# Patient Record
Sex: Male | Born: 1955 | Race: White | Hispanic: No | Marital: Single | Smoking: Former smoker
Health system: Southern US, Community
[De-identification: ages and names within clinical notes are randomized; demographics above are authoritative.]

## PROBLEM LIST (undated history)

## (undated) ENCOUNTER — Ambulatory Visit: Payer: Medicare HMO | Source: Home / Self Care

## (undated) DIAGNOSIS — I10 Essential (primary) hypertension: Secondary | ICD-10-CM

## (undated) DIAGNOSIS — K219 Gastro-esophageal reflux disease without esophagitis: Secondary | ICD-10-CM

## (undated) DIAGNOSIS — E785 Hyperlipidemia, unspecified: Secondary | ICD-10-CM

## (undated) DIAGNOSIS — Z860101 Personal history of adenomatous and serrated colon polyps: Secondary | ICD-10-CM

## (undated) DIAGNOSIS — M1990 Unspecified osteoarthritis, unspecified site: Secondary | ICD-10-CM

## (undated) HISTORY — PX: CHOLECYSTECTOMY: SHX55

## (undated) HISTORY — PX: VASECTOMY: SHX75

## (undated) HISTORY — DX: Personal history of adenomatous and serrated colon polyps: Z86.0101

## (undated) HISTORY — DX: Hyperlipidemia, unspecified: E78.5

---

## 2016-10-28 ENCOUNTER — Encounter: Payer: Self-pay | Admitting: *Deleted

## 2016-10-28 ENCOUNTER — Ambulatory Visit
Admission: EM | Admit: 2016-10-28 | Discharge: 2016-10-28 | Disposition: A | Discharge status: Home / Self Care | Payer: Self-pay | Attending: Family Medicine | Admitting: Family Medicine

## 2016-10-28 DIAGNOSIS — I10 Essential (primary) hypertension: Secondary | ICD-10-CM

## 2016-10-28 DIAGNOSIS — G5603 Carpal tunnel syndrome, bilateral upper limbs: Secondary | ICD-10-CM

## 2016-10-28 HISTORY — DX: Essential (primary) hypertension: I10

## 2016-10-28 HISTORY — DX: Gastro-esophageal reflux disease without esophagitis: K21.9

## 2016-10-28 HISTORY — DX: Unspecified osteoarthritis, unspecified site: M19.90

## 2016-10-28 LAB — BASIC METABOLIC PANEL
Anion gap: 8 (ref 5–15)
BUN: 16 mg/dL (ref 6–20)
CALCIUM: 9.7 mg/dL (ref 8.9–10.3)
CO2: 24 mmol/L (ref 22–32)
CREATININE: 0.76 mg/dL (ref 0.61–1.24)
Chloride: 103 mmol/L (ref 101–111)
GFR calc non Af Amer: >60 mL/min (ref >60)
Glucose, Bld: 103 mg/dL — ABNORMAL HIGH (ref 65–99)
Potassium: 3.6 mmol/L (ref 3.5–5.1)
SODIUM: 135 mmol/L (ref 135–145)

## 2016-10-28 MED ORDER — LISINOPRIL-HYDROCHLOROTHIAZIDE 20-12.5 MG PO TABS: 1.0000 | ORAL_TABLET | Freq: Every day | ORAL | 0 refills | Status: AC

## 2016-10-28 NOTE — ED Triage Notes (Signed)
Patient has been experiencing numbness and tingling in arms and had bilateral for the last 4 months. 1 week ago the tingling became worse.

## 2016-10-28 NOTE — ED Provider Notes (Signed)
[facility]    CSN: [id number] Arrival date & time: 10/28/16  1506     History   Chief Complaint Chief Complaint  Patient presents with  . Tingling    HPI Lucas Gerst. is a 61 y.o. male.   60 yo male with a c/o bilateral hand/finger numbness/tingling for 4 months but progressively worsening. Denies any injuries. Also requesting refill on blood pressure medication while he finds a new pcp as he moved here recently from [place].    The history is provided by the patient.    Past Medical History:  Diagnosis Date  . Arthritis   . GERD (gastroesophageal reflux disease)   . Hypertension     There are no active problems to display for this patient.   Past Surgical History:  Procedure Laterality Date  . CHOLECYSTECTOMY         Home Medications    Prior to Admission medications   Medication Sig Start Date End Date Taking? Authorizing Provider  atenolol (TENORMIN) 100 MG tablet Take 100 mg by mouth daily.   Yes Historical Provider, MD  esomeprazole (NEXIUM) 40 MG capsule Take 40 mg by mouth daily at 12 noon.   Yes Historical Provider, MD  lisinopril-hydrochlorothiazide (PRINZIDE,ZESTORETIC) 20-12.5 MG tablet Take 1 tablet by mouth daily. 10/28/16   Payton Mccallum, MD    Family History Family History  Problem Relation Age of Onset  . Dementia Mother     Social History Social History  Substance Use Topics  . Smoking status: Current Every Day Smoker    Packs/day: 1.00    Types: Cigarettes  . Smokeless tobacco: Former Neurosurgeon  . Alcohol use Yes     Allergies   Patient has no known allergies.   Review of Systems Review of Systems   Physical Exam Triage Vital Signs ED Triage Vitals  Enc Vitals Group     BP 10/28/16 1536 133/83     Pulse Rate 10/28/16 1536 (!) 117     Resp 10/28/16 1536 16     Temp 10/28/16 1536 98.2 F (36.8 C)     Temp Source 10/28/16 1536 Oral     SpO2 10/28/16 1536 99 %     Weight 10/28/16 1537 197 lb (89.4  kg)     Height 10/28/16 1537 6' (1.829 m)     Head Circumference --      Peak Flow --      Pain Score 10/28/16 1538 4     Pain Loc --      Pain Edu? --      Excl. in GC? --    No data found.   Updated Vital Signs BP 133/83 (BP Location: Left Arm)   Pulse (!) 117   Temp 98.2 F (36.8 C) (Oral)   Resp 16   Ht 6' (1.829 m)   Wt 197 lb (89.4 kg)   SpO2 99%   BMI 26.72 kg/m   Visual Acuity Right Eye Distance:   Left Eye Distance:   Bilateral Distance:    Right Eye Near:   Left Eye Near:    Bilateral Near:     Physical Exam  Constitutional: He appears well-developed and well-nourished. No distress.  Cardiovascular: Normal rate, regular rhythm, normal heart sounds and intact distal pulses.   Pulmonary/Chest: Effort normal and breath sounds normal. No respiratory distress.  Musculoskeletal: He exhibits no edema, tenderness or deformity.  Neurological: He is alert.  Positive Phalen's test  Skin: He is not diaphoretic.  Vitals reviewed.    UC Treatments / Results  Labs (all labs ordered are listed, but only abnormal results are displayed) Labs Reviewed  BASIC METABOLIC PANEL - Abnormal; Notable for the following:       Result Value   Glucose, Bld 103 (*)    All other components within normal limits    EKG  EKG Interpretation None       Radiology No results found.  Procedures Procedures (including critical care time)  Medications Ordered in UC Medications - No data to display   Initial Impression / Assessment and Plan / UC Course  I have reviewed the triage vital signs and the nursing notes.  Pertinent labs & imaging results that were available during my care of the patient were reviewed by me and considered in my medical decision making (see chart for details).       Final Clinical Impressions(s) / UC Diagnoses   Final diagnoses:  Bilateral carpal tunnel syndrome  Hypertension, unspecified type    New Prescriptions Discharge Medication  List as of 10/28/2016  4:33 PM     1. Lab results and diagnosis reviewed with patient 2. rx as per orders above; reviewed possible side effects, interactions, risks and benefits; rx sent for lisinopril/HCTZ as per orders   3. Recommend supportive treatment with wrist splints, otc analgesics prn 4. Follow-up prn if symptoms worsen or don't improve   Payton Mccallum, MD 10/28/16 [phone]

## 2016-12-25 ENCOUNTER — Ambulatory Visit
Admission: EM | Admit: 2016-12-25 | Discharge: 2016-12-25 | Disposition: A | Discharge status: Home / Self Care | Payer: BLUE CROSS/BLUE SHIELD | Attending: Family Medicine | Admitting: Family Medicine

## 2016-12-25 DIAGNOSIS — R109 Unspecified abdominal pain: Secondary | ICD-10-CM

## 2016-12-25 DIAGNOSIS — R197 Diarrhea, unspecified: Secondary | ICD-10-CM

## 2016-12-25 MED ORDER — HYOSCYAMINE SULFATE SL 0.125 MG SL SUBL: SUBLINGUAL_TABLET | SUBLINGUAL | 0 refills | Status: DC

## 2016-12-25 MED ORDER — ONDANSETRON HCL 4 MG PO TABS: 4.0000 mg | ORAL_TABLET | Freq: Three times a day (TID) | ORAL | 0 refills | Status: DC | PRN

## 2016-12-25 NOTE — ED Provider Notes (Signed)
CSN: [id number]     Arrival date & time 12/25/16  1100 History   First MD Initiated Contact with Patient 12/25/16 1136     Chief Complaint  Patient presents with  . Abdominal Pain    lower  . Nausea  . Diarrhea   (Consider location/radiation/quality/duration/timing/severity/associated sxs/prior Treatment) 61 year old male presents with intermittent lower abdominal cramping/pain and diarrhea for the past 2 days. He vomited once 2 days ago but otherwise has been able to keep down fluids. Still has some nausea. Denies any fever, URI symptoms, dysuria or blood in his stool. Concerned that many family members and friends have had cancer or other serious chronic illnesses and worried about current etiology. He also knows that a co-worker has had GI issue this week as well. Has not eaten any usual food. No travel. Last colonoscopy about 5 years ago and normal. Has history of HTN, GERD, Hiatal hernia and Tremors and takes medication for management.    The history is provided by the patient.    Past Medical History:  Diagnosis Date  . Arthritis   . GERD (gastroesophageal reflux disease)   . Hypertension    Past Surgical History:  Procedure Laterality Date  . CHOLECYSTECTOMY    . VASECTOMY     Family History  Problem Relation Age of Onset  . Dementia Mother    Social History  Substance Use Topics  . Smoking status: Current Every Day Smoker    Packs/day: 1.00    Types: Cigarettes  . Smokeless tobacco: Current User  . Alcohol use Yes     Comment: occ    Review of Systems  Constitutional: Positive for appetite change. Negative for activity change, chills, fatigue and fever.  HENT: Negative for congestion and sore throat.   Eyes: Negative for pain, discharge and itching.  Respiratory: Negative for cough, chest tightness, shortness of breath and wheezing.   Cardiovascular: Negative for chest pain.  Gastrointestinal: Positive for abdominal pain (cramping), diarrhea and nausea.  Negative for blood in stool and vomiting.  Genitourinary: Negative for decreased urine volume, difficulty urinating, dysuria, flank pain and hematuria.  Musculoskeletal: Negative for back pain and neck pain.  Skin: Negative for rash and wound.  Neurological: Positive for tremors. Negative for dizziness, seizures, syncope, weakness, light-headedness and headaches.  Hematological: Negative for adenopathy.    Allergies  Patient has no known allergies.  Home Medications   Prior to Admission medications   Medication Sig Start Date End Date Taking? Authorizing Provider  atenolol (TENORMIN) 100 MG tablet Take 100 mg by mouth daily.   Yes [provider]  esomeprazole (NEXIUM) 40 MG capsule Take 40 mg by mouth daily at 12 noon.   Yes [provider]  lisinopril-hydrochlorothiazide (PRINZIDE,ZESTORETIC) 20-12.5 MG tablet Take 1 tablet by mouth daily. 10/28/16  Yes Payton Mccallumonty, [place], MD  Hyoscyamine Sulfate SL (LEVSIN/SL) 0.125 MG SUBL Take 1 to 2 tablets every 6 hours as needed for abdominal pain/cramping 12/25/16   Sudie GrumblingAmyot, Ann Berry, NP  ondansetron (ZOFRAN) 4 MG tablet Take 1 tablet (4 mg total) by mouth every 8 (eight) hours as needed for nausea or vomiting. 12/25/16   Amyot, Ali LoweAnn Berry, NP   Meds Ordered and Administered this Visit  Medications - No data to display  BP 118/75 (BP Location: Left Arm)   Pulse (!) 46   Temp 98.5 F (36.9 C) (Oral)   Resp 16   Ht 6' (1.829 m)   Wt 197 lb (89.4 kg)   SpO2  99%   BMI 26.72 kg/m  No data found.   Physical Exam  Constitutional: He is oriented to person, place, and time. He appears well-developed and well-nourished. No distress.  HENT:  Head: Normocephalic and atraumatic.  Right Ear: Hearing, tympanic membrane, external ear and ear canal normal.  Left Ear: Hearing, tympanic membrane, external ear and ear canal normal.  Nose: Nose normal.  Mouth/Throat: Uvula is midline, oropharynx is clear and moist and mucous membranes are  normal.  Neck: Normal range of motion. Neck supple.  Cardiovascular: Regular rhythm and normal heart sounds.  Bradycardia present.   No murmur heard. Pulmonary/Chest: Effort normal. No respiratory distress. He has no decreased breath sounds. He has wheezes in the right upper field and the left upper field. He has no rhonchi.  Abdominal: Soft. Normal appearance. Bowel sounds are increased. There is no hepatosplenomegaly. There is generalized tenderness. There is no rigidity, no rebound, no guarding and no CVA tenderness.  Musculoskeletal: Normal range of motion.  Neurological: He is alert and oriented to person, place, and time.  Skin: Skin is warm and dry.  Psychiatric: He has a normal mood and affect. His speech is normal and behavior is normal. Thought content normal.  Very talkative    Urgent Care Course     Procedures (including critical care time)  Labs Review Labs Reviewed - No data to display  Imaging Review No results found.   Visual Acuity Review  Right Eye Distance:   Left Eye Distance:   Bilateral Distance:    Right Eye Near:   Left Eye Near:    Bilateral Near:         MDM   1. Diarrhea, unspecified type   2. Abdominal cramping    Discussed that he probably has a viral illness. Recommend take Zofran 4mg  every 8 hours as needed for nausea. May take Levsin 1 to 2 tablets every 6 hours as needed for abdominal pain/cramping. Continue to drink fluids- gatorade/Pedialyte and eat small, frequent meals. Note written for work for [employer]. Follow-up with his Primary care provider in 3 to 4 days if not improving as well as to discuss recommendations for next colonoscopy.      Sudie Grumbling, NP 12/26/16 [phone]

## 2016-12-25 NOTE — ED Triage Notes (Signed)
61 year old Caucasian male is her today with complaints of lower abdomen pain that started Wednesday night. Patient states he has had some diarrhea and nausea as well. Patient states he does have a history of Acid Reflux, Hernia, and cholecystectomy. Patient states he was told a guy at his job was out of work Wednesday due to stomach pain.

## 2016-12-25 NOTE — Discharge Instructions (Addendum)
Recommend take Zofran 4mg  every 8 hours as needed for nausea. May take Levsin 1 to 2 tablets every 6 hours as needed for abdominal pain/cramping. Continue to drink fluids- gatorade/Pedialyte and eat small, frequent meals. Follow-up with your Primary care provider in 3 to 4 days if not improving.

## 2017-01-06 DIAGNOSIS — G250 Essential tremor: Secondary | ICD-10-CM | POA: Insufficient documentation

## 2017-01-06 DIAGNOSIS — Z720 Tobacco use: Secondary | ICD-10-CM | POA: Insufficient documentation

## 2017-01-06 DIAGNOSIS — I10 Essential (primary) hypertension: Secondary | ICD-10-CM | POA: Insufficient documentation

## 2017-01-06 DIAGNOSIS — K219 Gastro-esophageal reflux disease without esophagitis: Secondary | ICD-10-CM | POA: Insufficient documentation

## 2017-11-01 ENCOUNTER — Ambulatory Visit (HOSPITAL_COMMUNITY)
Admission: EM | Admit: 2017-11-01 | Discharge: 2017-11-01 | Disposition: A | Discharge status: Home / Self Care | Payer: Self-pay

## 2017-11-01 ENCOUNTER — Emergency Department (HOSPITAL_COMMUNITY)
Admission: EM | Admit: 2017-11-01 | Discharge: 2017-11-01 | Disposition: A | Discharge status: Home / Self Care | Payer: Self-pay | Attending: Emergency Medicine | Admitting: Emergency Medicine

## 2017-11-01 ENCOUNTER — Emergency Department (HOSPITAL_COMMUNITY): Payer: Self-pay

## 2017-11-01 ENCOUNTER — Other Ambulatory Visit: Payer: Self-pay

## 2017-11-01 ENCOUNTER — Ambulatory Visit (HOSPITAL_COMMUNITY)
Admission: EM | Admit: 2017-11-01 | Discharge: 2017-11-01 | Disposition: A | Discharge status: Home / Self Care | Payer: BLUE CROSS/BLUE SHIELD

## 2017-11-01 ENCOUNTER — Encounter (HOSPITAL_COMMUNITY): Payer: Self-pay | Admitting: Emergency Medicine

## 2017-11-01 DIAGNOSIS — J209 Acute bronchitis, unspecified: Secondary | ICD-10-CM | POA: Insufficient documentation

## 2017-11-01 DIAGNOSIS — I10 Essential (primary) hypertension: Secondary | ICD-10-CM | POA: Insufficient documentation

## 2017-11-01 DIAGNOSIS — F17210 Nicotine dependence, cigarettes, uncomplicated: Secondary | ICD-10-CM | POA: Insufficient documentation

## 2017-11-01 DIAGNOSIS — Z79899 Other long term (current) drug therapy: Secondary | ICD-10-CM | POA: Insufficient documentation

## 2017-11-01 LAB — CBC WITH DIFFERENTIAL/PLATELET
BASOS PCT: 1 %
Basophils Absolute: 0 10*3/uL (ref 0.0–0.1)
EOS PCT: 1 %
Eosinophils Absolute: 0.1 10*3/uL (ref 0.0–0.7)
HCT: 47.4 % (ref 39.0–52.0)
Hemoglobin: 16.2 g/dL (ref 13.0–17.0)
LYMPHS ABS: 1.1 10*3/uL (ref 0.7–4.0)
Lymphocytes Relative: 13 %
MCH: 32.4 pg (ref 26.0–34.0)
MCHC: 34.2 g/dL (ref 30.0–36.0)
MCV: 94.8 fL (ref 78.0–100.0)
MONOS PCT: 9 %
Monocytes Absolute: 0.8 10*3/uL (ref 0.1–1.0)
NEUTROS PCT: 76 %
Neutro Abs: 6.5 10*3/uL (ref 1.7–7.7)
PLATELETS: 189 10*3/uL (ref 150–400)
RBC: 5 MIL/uL (ref 4.22–5.81)
RDW: 13.3 % (ref 11.5–15.5)
WBC: 8.5 10*3/uL (ref 4.0–10.5)

## 2017-11-01 LAB — BASIC METABOLIC PANEL
Anion gap: 8 (ref 5–15)
BUN: 18 mg/dL (ref 6–20)
CALCIUM: 9 mg/dL (ref 8.9–10.3)
CHLORIDE: 101 mmol/L (ref 101–111)
CO2: 28 mmol/L (ref 22–32)
CREATININE: 1.03 mg/dL (ref 0.61–1.24)
GFR calc non Af Amer: >60 mL/min (ref >60)
Glucose, Bld: 97 mg/dL (ref 65–99)
Potassium: 4.1 mmol/L (ref 3.5–5.1)
SODIUM: 137 mmol/L (ref 135–145)

## 2017-11-01 MED ORDER — DOXYCYCLINE HYCLATE 100 MG PO CAPS
100.0000 mg | ORAL_CAPSULE | Freq: Two times a day (BID) | ORAL | 0 refills | Status: DC
Start: 2017-11-01 — End: 2023-03-21

## 2017-11-01 MED ORDER — PREDNISONE 10 MG PO TABS: ORAL_TABLET | ORAL | 0 refills | Status: DC

## 2017-11-01 MED ORDER — ALBUTEROL SULFATE HFA 108 (90 BASE) MCG/ACT IN AERS
2.0000 | INHALATION_SPRAY | RESPIRATORY_TRACT | Status: DC
  Administered 2017-11-01: 2 via RESPIRATORY_TRACT
  Filled 2017-11-01: qty 6.7

## 2017-11-01 NOTE — ED Triage Notes (Signed)
Pt c/o of cough, congestion, fever since Saturday.

## 2017-11-01 NOTE — ED Provider Notes (Signed)
[facility] EMERGENCY DEPARTMENT Provider Note   CSN: [id number] Arrival date & time: 11/01/17  1035     History   Chief Complaint Chief Complaint  Patient presents with  . Cough    HPI Lucas Shaffer. is a 62 y.o. male.  The history is provided by the patient. No language interpreter was used.  Cough  This is a new problem. The problem occurs constantly.    Past Medical History:  Diagnosis Date  . Arthritis   . GERD (gastroesophageal reflux disease)   . Hypertension     There are no active problems to display for this patient.   Past Surgical History:  Procedure Laterality Date  . CHOLECYSTECTOMY    . VASECTOMY          Home Medications    Prior to Admission medications   Medication Sig Start Date End Date Taking? Authorizing Provider  atenolol (TENORMIN) 100 MG tablet Take 100 mg by mouth daily.    [provider]  doxycycline (VIBRAMYCIN) 100 MG capsule Take 1 capsule (100 mg total) by mouth 2 (two) times daily. 11/01/17   Elson Areas, PA-C  esomeprazole (NEXIUM) 40 MG capsule Take 40 mg by mouth daily at 12 noon.    [provider]  Hyoscyamine Sulfate SL (LEVSIN/SL) 0.125 MG SUBL Take 1 to 2 tablets every 6 hours as needed for abdominal pain/cramping 12/25/16   Amyot, Ali Lowe, NP  lisinopril-hydrochlorothiazide (PRINZIDE,ZESTORETIC) 20-12.5 MG tablet Take 1 tablet by mouth daily. 10/28/16   Payton Mccallum, MD  ondansetron (ZOFRAN) 4 MG tablet Take 1 tablet (4 mg total) by mouth every 8 (eight) hours as needed for nausea or vomiting. 12/25/16   Sudie Grumbling, NP  predniSONE (DELTASONE) 10 MG tablet One tablet a day for 5 days 11/01/17   Elson Areas, PA-C    Family History Family History  Problem Relation Age of Onset  . Dementia Mother     Social History Social History   Tobacco Use  . Smoking status: Current Every Day Smoker    Packs/day: 1.00    Types: Cigarettes  . Smokeless tobacco: Current User  Substance Use  Topics  . Alcohol use: Yes    Comment: occ  . Drug use: No     Allergies   Patient has no known allergies.   Review of Systems Review of Systems  Respiratory: Positive for cough.   All other systems reviewed and are negative.    Physical Exam Updated Vital Signs BP (!) 125/94 (BP Location: Right Arm)   Pulse 66   Temp 99.5 F (37.5 C) (Oral)   Resp (!) 25   Ht 6' (1.829 m)   Wt 91.6 kg (202 lb)   SpO2 98%   BMI 27.40 kg/m   Physical Exam  Constitutional: He is oriented to person, place, and time. He appears well-developed and well-nourished.  HENT:  Head: Normocephalic.  Right Ear: External ear normal.  Left Ear: External ear normal.  Nose: Nose normal.  Mouth/Throat: Oropharynx is clear and moist.  Eyes: EOM are normal.  Neck: Normal range of motion.  Cardiovascular: Normal rate.  Pulmonary/Chest: Effort normal.  Abdominal: Soft. He exhibits no distension.  Musculoskeletal: Normal range of motion.  Neurological: He is alert and oriented to person, place, and time.  Skin: Skin is warm.  Psychiatric: He has a normal mood and affect.  Nursing note and vitals reviewed.    ED Treatments / Results  Labs (all labs ordered  are listed, but only abnormal results are displayed) Labs Reviewed  BASIC METABOLIC PANEL  CBC WITH DIFFERENTIAL/PLATELET    EKG None  Radiology Dg Chest 2 View  Result Date: 11/01/2017 CLINICAL DATA:  Cough, congestion and fever for 2 days.  Smoker. EXAM: CHEST - 2 VIEW COMPARISON:  None. FINDINGS: The heart size and mediastinal contours are normal. Mild interstitial prominence in the lungs is attributed to smoking. There is no edema, confluent airspace opacity or pleural effusion. Flowing syndesmophytes in the thoracic spine consistent with diffuse idiopathic skeletal hyperostosis or ankylosing spondylitis. IMPRESSION: No acute findings.  Mild smoking related changes in the lungs. Electronically Signed   By: Carey Bullocks M.D.   On:  11/01/2017 11:12    Procedures Procedures (including critical care time)  Medications Ordered in ED Medications  albuterol (PROVENTIL HFA;VENTOLIN HFA) 108 (90 Base) MCG/ACT inhaler 2 puff (2 puffs Inhalation Given 11/01/17 1217)     Initial Impression / Assessment and Plan / ED Course  I have reviewed the triage vital signs and the nursing notes.  Pertinent labs & imaging results that were available during my care of the patient were reviewed by me and considered in my medical decision making (see chart for details).     Pt reports he is a smoker.   Chest xray shows chronic changes.  Pt given albuterol inhaler,  rx for prednisone and doxycycline   Final Clinical Impressions(s) / ED Diagnoses   Final diagnoses:  Acute bronchitis, unspecified organism    ED Discharge Orders        Ordered    predniSONE (DELTASONE) 10 MG tablet     11/01/17 1212    doxycycline (VIBRAMYCIN) 100 MG capsule  2 times daily     11/01/17 1212    An After Visit Summary was printed and given to the patient.    Elson Areas, PA-C 11/01/17 1400    Donnetta Hutching, MD 11/01/17 1531

## 2017-11-01 NOTE — Discharge Instructions (Signed)
Return if any problems.

## 2019-08-29 IMAGING — DX DG CHEST 2V
2 series · 2 of 2 positions shown · non-contrast
Comparison: None.

CLINICAL DATA: Cough, congestion and fever for 2 days.  Smoker.

EXAM:
CHEST - 2 VIEW

[chest pa]
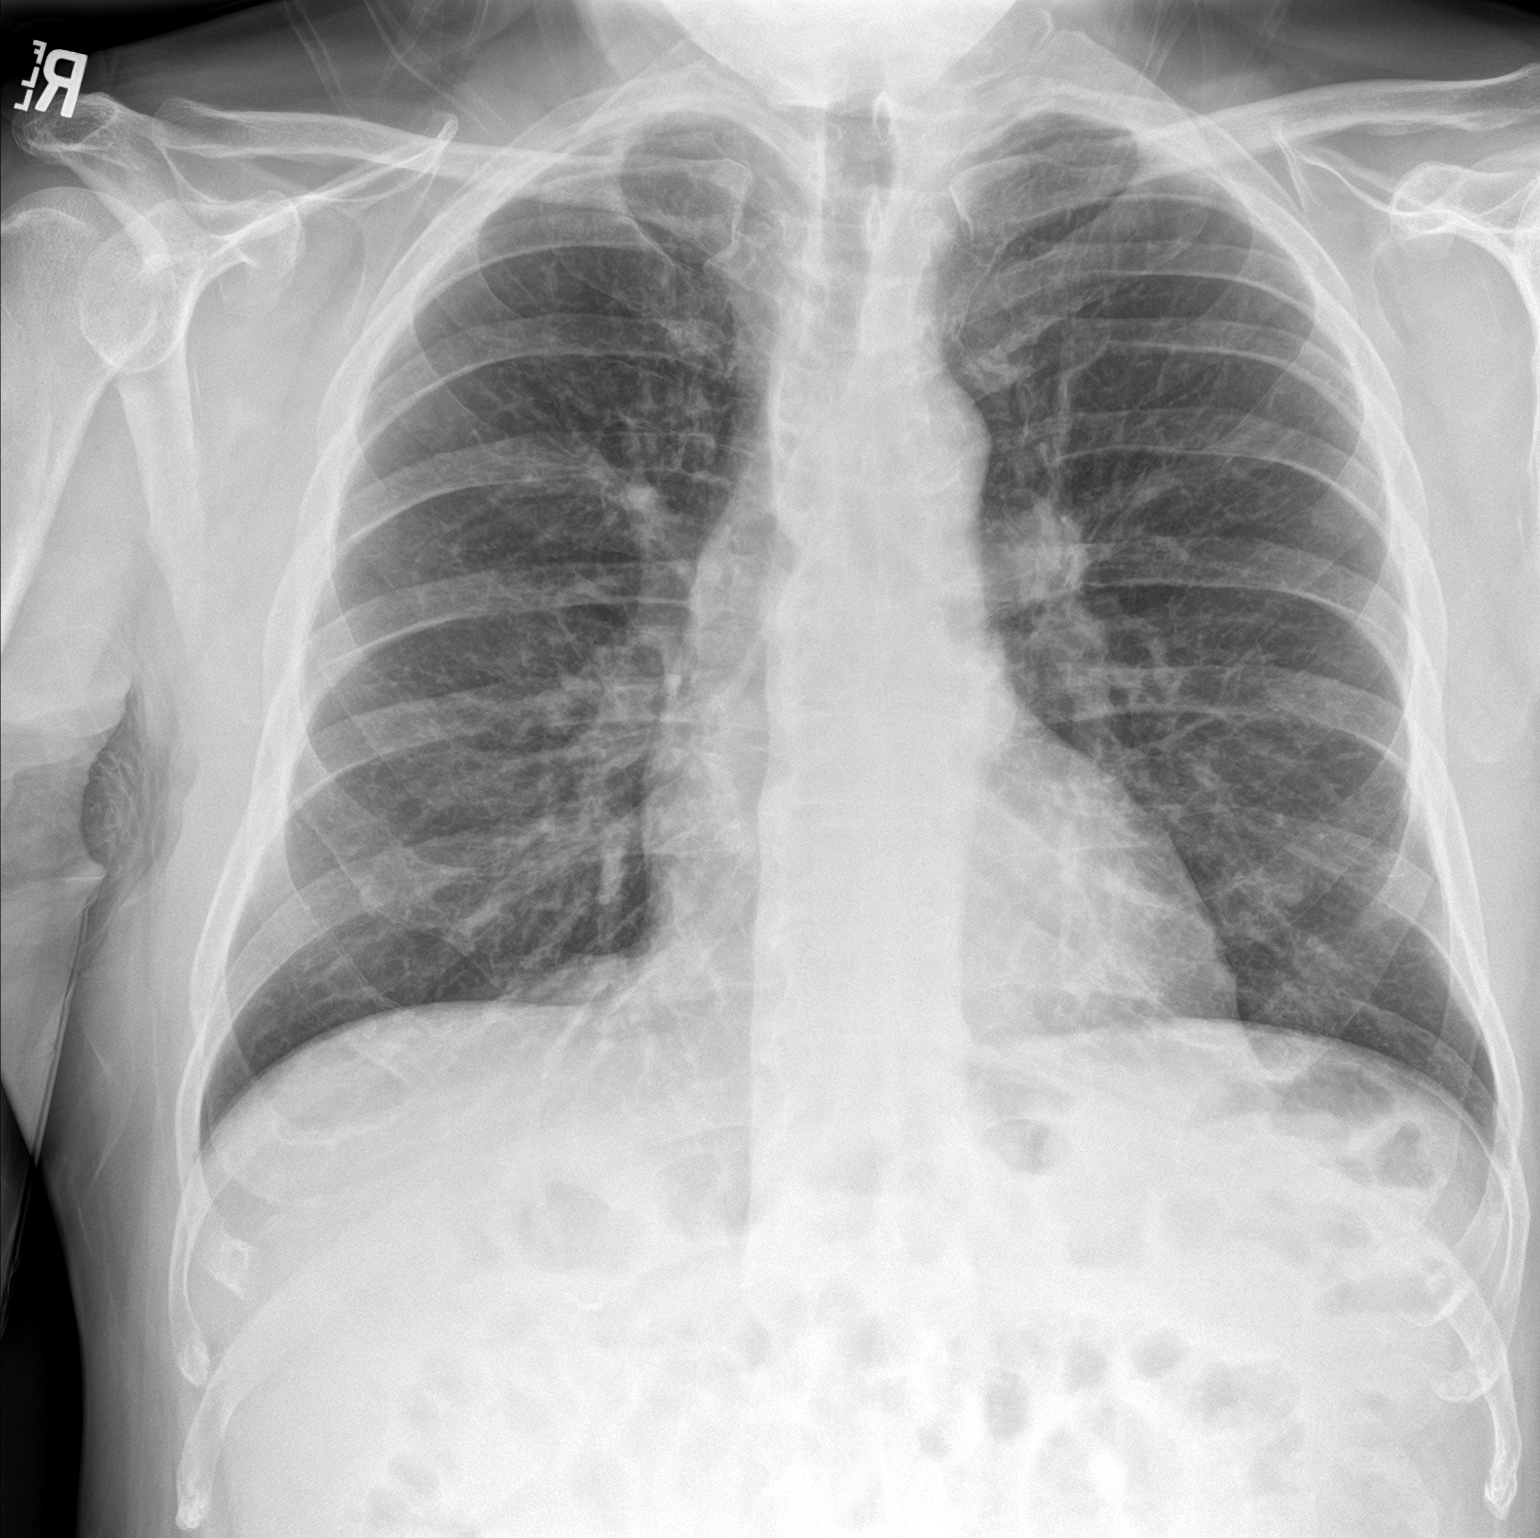

[chest lat]
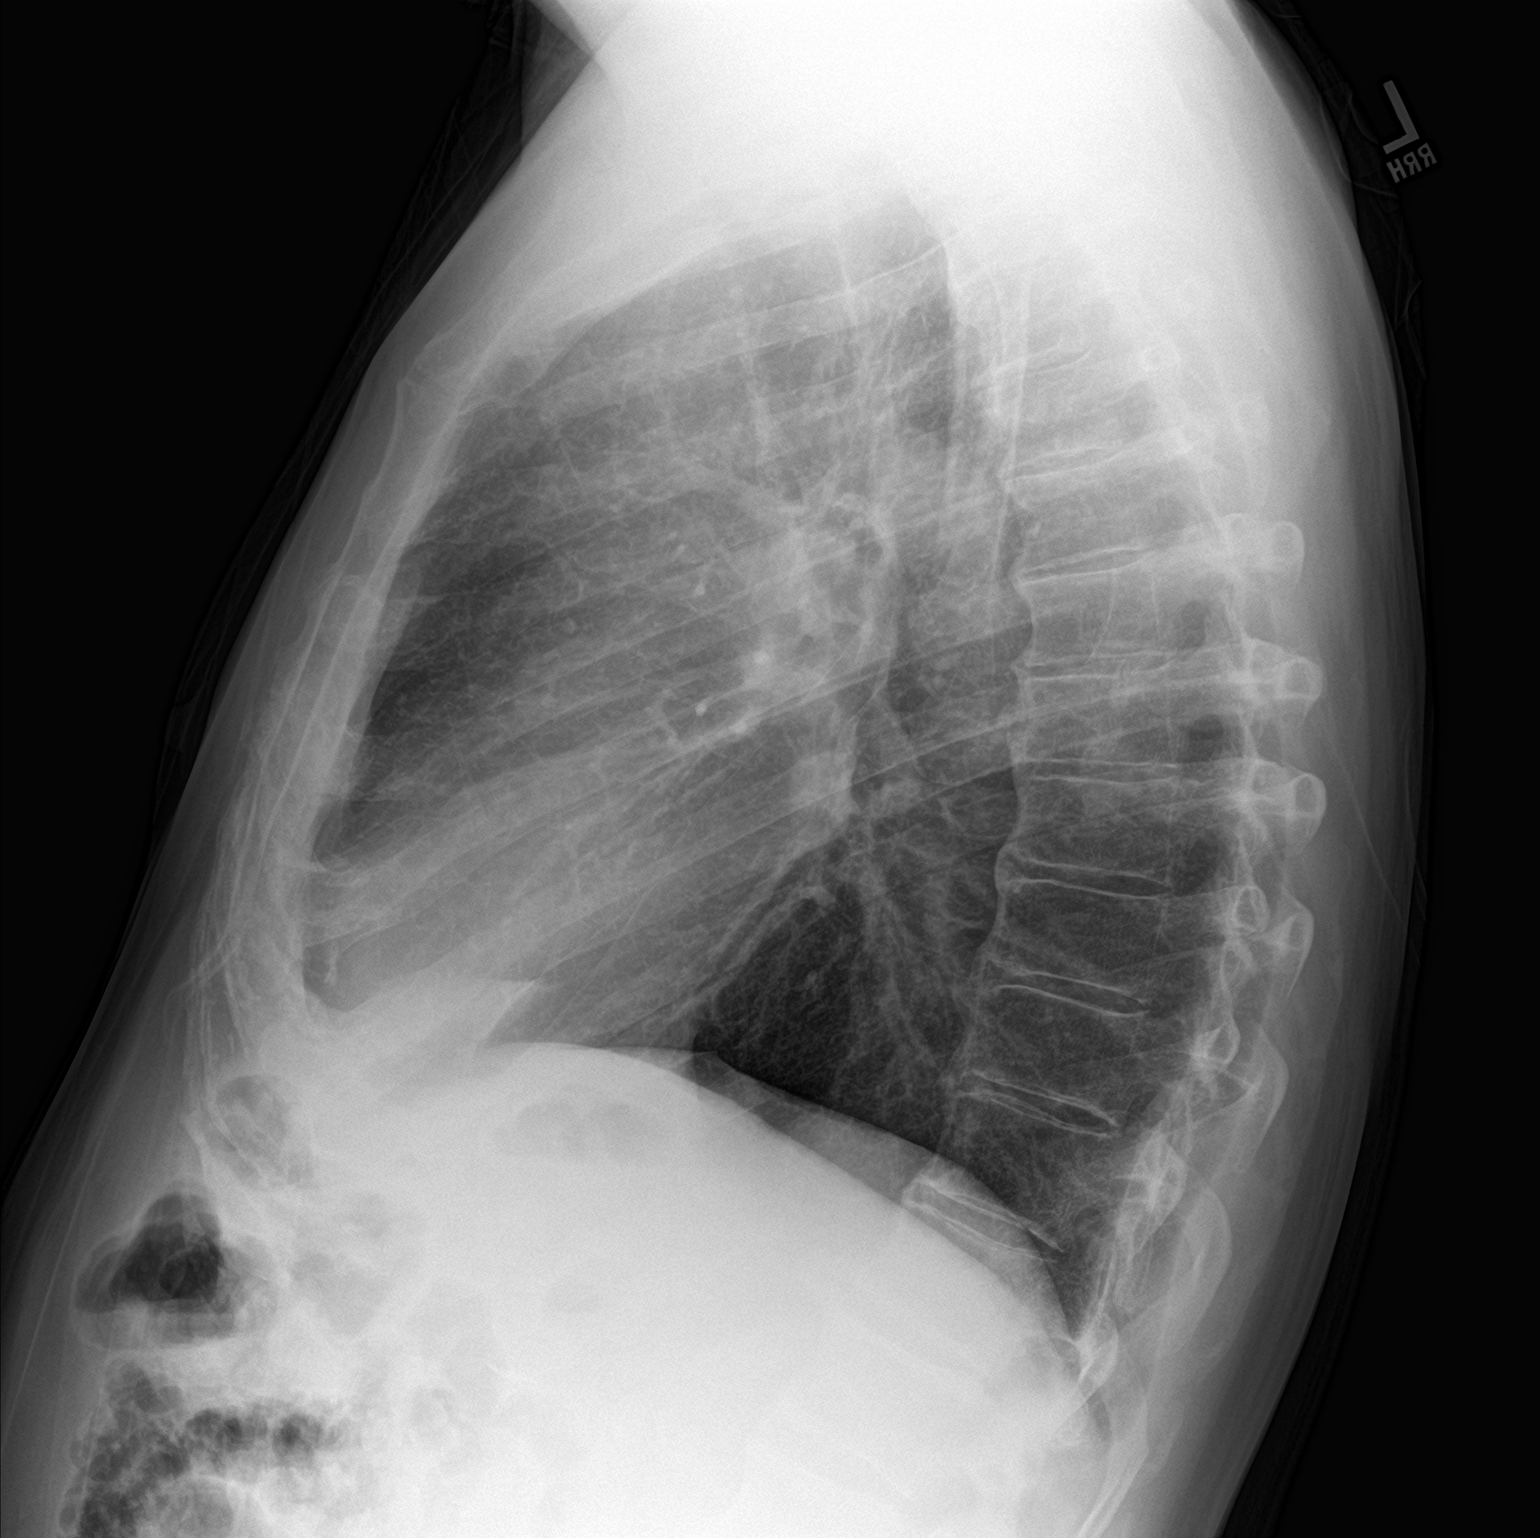

[2 of 2 positions shown; findings below may reference images not displayed]

FINDINGS: The heart size and mediastinal contours are normal. Mild
interstitial prominence in the lungs is attributed to smoking. There
is no edema, confluent airspace opacity or pleural effusion. Flowing
syndesmophytes in the thoracic spine consistent with diffuse
idiopathic skeletal hyperostosis or ankylosing spondylitis.
IMPRESSION: No acute findings.  Mild smoking related changes in the lungs.

## 2022-01-20 DIAGNOSIS — G5603 Carpal tunnel syndrome, bilateral upper limbs: Secondary | ICD-10-CM | POA: Insufficient documentation

## 2022-01-23 DIAGNOSIS — N201 Calculus of ureter: Secondary | ICD-10-CM | POA: Insufficient documentation

## 2022-09-14 DIAGNOSIS — B349 Viral infection, unspecified: Secondary | ICD-10-CM | POA: Diagnosis not present

## 2022-09-14 DIAGNOSIS — R059 Cough, unspecified: Secondary | ICD-10-CM | POA: Diagnosis not present

## 2022-09-14 DIAGNOSIS — M25572 Pain in left ankle and joints of left foot: Secondary | ICD-10-CM | POA: Diagnosis not present

## 2022-09-14 DIAGNOSIS — M25561 Pain in right knee: Secondary | ICD-10-CM | POA: Diagnosis not present

## 2022-09-14 DIAGNOSIS — R509 Fever, unspecified: Secondary | ICD-10-CM | POA: Diagnosis not present

## 2022-09-14 DIAGNOSIS — M1990 Unspecified osteoarthritis, unspecified site: Secondary | ICD-10-CM | POA: Diagnosis not present

## 2022-09-14 DIAGNOSIS — M25571 Pain in right ankle and joints of right foot: Secondary | ICD-10-CM | POA: Diagnosis not present

## 2022-09-14 DIAGNOSIS — R0981 Nasal congestion: Secondary | ICD-10-CM | POA: Diagnosis not present

## 2022-10-11 DIAGNOSIS — M25561 Pain in right knee: Secondary | ICD-10-CM | POA: Diagnosis not present

## 2022-10-11 DIAGNOSIS — M2550 Pain in unspecified joint: Secondary | ICD-10-CM | POA: Diagnosis not present

## 2022-10-11 DIAGNOSIS — M7989 Other specified soft tissue disorders: Secondary | ICD-10-CM | POA: Diagnosis not present

## 2022-10-11 DIAGNOSIS — Z87891 Personal history of nicotine dependence: Secondary | ICD-10-CM | POA: Diagnosis not present

## 2022-10-11 DIAGNOSIS — I10 Essential (primary) hypertension: Secondary | ICD-10-CM | POA: Diagnosis not present

## 2022-10-11 DIAGNOSIS — I739 Peripheral vascular disease, unspecified: Secondary | ICD-10-CM | POA: Diagnosis not present

## 2022-10-11 DIAGNOSIS — M25461 Effusion, right knee: Secondary | ICD-10-CM | POA: Diagnosis not present

## 2022-10-11 DIAGNOSIS — M25562 Pain in left knee: Secondary | ICD-10-CM | POA: Diagnosis not present

## 2022-10-17 DIAGNOSIS — E785 Hyperlipidemia, unspecified: Secondary | ICD-10-CM | POA: Diagnosis not present

## 2022-10-17 DIAGNOSIS — Z809 Family history of malignant neoplasm, unspecified: Secondary | ICD-10-CM | POA: Diagnosis not present

## 2022-10-17 DIAGNOSIS — Z87891 Personal history of nicotine dependence: Secondary | ICD-10-CM | POA: Diagnosis not present

## 2022-10-17 DIAGNOSIS — K219 Gastro-esophageal reflux disease without esophagitis: Secondary | ICD-10-CM | POA: Diagnosis not present

## 2022-10-17 DIAGNOSIS — M1990 Unspecified osteoarthritis, unspecified site: Secondary | ICD-10-CM | POA: Diagnosis not present

## 2022-10-17 DIAGNOSIS — Z791 Long term (current) use of non-steroidal anti-inflammatories (NSAID): Secondary | ICD-10-CM | POA: Diagnosis not present

## 2022-10-17 DIAGNOSIS — G250 Essential tremor: Secondary | ICD-10-CM | POA: Diagnosis not present

## 2022-10-17 DIAGNOSIS — Z8249 Family history of ischemic heart disease and other diseases of the circulatory system: Secondary | ICD-10-CM | POA: Diagnosis not present

## 2022-10-17 DIAGNOSIS — Z6830 Body mass index (BMI) 30.0-30.9, adult: Secondary | ICD-10-CM | POA: Diagnosis not present

## 2022-10-17 DIAGNOSIS — Z008 Encounter for other general examination: Secondary | ICD-10-CM | POA: Diagnosis not present

## 2022-10-17 DIAGNOSIS — E669 Obesity, unspecified: Secondary | ICD-10-CM | POA: Diagnosis not present

## 2022-10-17 DIAGNOSIS — I10 Essential (primary) hypertension: Secondary | ICD-10-CM | POA: Diagnosis not present

## 2022-10-17 DIAGNOSIS — K5900 Constipation, unspecified: Secondary | ICD-10-CM | POA: Diagnosis not present

## 2022-11-10 DIAGNOSIS — K219 Gastro-esophageal reflux disease without esophagitis: Secondary | ICD-10-CM | POA: Diagnosis not present

## 2022-11-10 DIAGNOSIS — Z6831 Body mass index (BMI) 31.0-31.9, adult: Secondary | ICD-10-CM | POA: Diagnosis not present

## 2022-11-10 DIAGNOSIS — I10 Essential (primary) hypertension: Secondary | ICD-10-CM | POA: Diagnosis not present

## 2022-11-10 DIAGNOSIS — G250 Essential tremor: Secondary | ICD-10-CM | POA: Diagnosis not present

## 2022-11-10 DIAGNOSIS — M1711 Unilateral primary osteoarthritis, right knee: Secondary | ICD-10-CM | POA: Diagnosis not present

## 2022-11-10 DIAGNOSIS — E7849 Other hyperlipidemia: Secondary | ICD-10-CM | POA: Diagnosis not present

## 2022-12-08 DIAGNOSIS — Z0100 Encounter for examination of eyes and vision without abnormal findings: Secondary | ICD-10-CM | POA: Diagnosis not present

## 2022-12-08 DIAGNOSIS — H5203 Hypermetropia, bilateral: Secondary | ICD-10-CM | POA: Diagnosis not present

## 2022-12-08 DIAGNOSIS — H3530 Unspecified macular degeneration: Secondary | ICD-10-CM | POA: Diagnosis not present

## 2022-12-08 DIAGNOSIS — I10 Essential (primary) hypertension: Secondary | ICD-10-CM | POA: Diagnosis not present

## 2022-12-08 DIAGNOSIS — H2513 Age-related nuclear cataract, bilateral: Secondary | ICD-10-CM | POA: Diagnosis not present

## 2023-01-07 DIAGNOSIS — E782 Mixed hyperlipidemia: Secondary | ICD-10-CM | POA: Diagnosis not present

## 2023-01-07 DIAGNOSIS — E7849 Other hyperlipidemia: Secondary | ICD-10-CM | POA: Diagnosis not present

## 2023-01-07 DIAGNOSIS — Z131 Encounter for screening for diabetes mellitus: Secondary | ICD-10-CM | POA: Diagnosis not present

## 2023-01-07 DIAGNOSIS — G250 Essential tremor: Secondary | ICD-10-CM | POA: Diagnosis not present

## 2023-01-07 DIAGNOSIS — I10 Essential (primary) hypertension: Secondary | ICD-10-CM | POA: Diagnosis not present

## 2023-01-07 DIAGNOSIS — Z0000 Encounter for general adult medical examination without abnormal findings: Secondary | ICD-10-CM | POA: Diagnosis not present

## 2023-01-14 DIAGNOSIS — E7849 Other hyperlipidemia: Secondary | ICD-10-CM | POA: Diagnosis not present

## 2023-01-14 DIAGNOSIS — Z0000 Encounter for general adult medical examination without abnormal findings: Secondary | ICD-10-CM | POA: Diagnosis not present

## 2023-01-14 DIAGNOSIS — Z1389 Encounter for screening for other disorder: Secondary | ICD-10-CM | POA: Diagnosis not present

## 2023-01-14 DIAGNOSIS — Z6830 Body mass index (BMI) 30.0-30.9, adult: Secondary | ICD-10-CM | POA: Diagnosis not present

## 2023-01-14 DIAGNOSIS — I10 Essential (primary) hypertension: Secondary | ICD-10-CM | POA: Diagnosis not present

## 2023-01-14 DIAGNOSIS — Z1331 Encounter for screening for depression: Secondary | ICD-10-CM | POA: Diagnosis not present

## 2023-01-21 DIAGNOSIS — Z6830 Body mass index (BMI) 30.0-30.9, adult: Secondary | ICD-10-CM | POA: Diagnosis not present

## 2023-01-21 DIAGNOSIS — R7989 Other specified abnormal findings of blood chemistry: Secondary | ICD-10-CM | POA: Diagnosis not present

## 2023-02-11 DIAGNOSIS — E291 Testicular hypofunction: Secondary | ICD-10-CM | POA: Diagnosis not present

## 2023-02-25 DIAGNOSIS — E291 Testicular hypofunction: Secondary | ICD-10-CM | POA: Diagnosis not present

## 2023-03-16 DIAGNOSIS — E291 Testicular hypofunction: Secondary | ICD-10-CM | POA: Diagnosis not present

## 2023-03-21 ENCOUNTER — Ambulatory Visit (HOSPITAL_COMMUNITY)
Admission: EM | Admit: 2023-03-21 | Discharge: 2023-03-21 | Disposition: A | Discharge status: Home / Self Care | Payer: Medicare HMO | Attending: Emergency Medicine | Admitting: Emergency Medicine

## 2023-03-21 ENCOUNTER — Encounter (HOSPITAL_COMMUNITY): Payer: Self-pay

## 2023-03-21 ENCOUNTER — Ambulatory Visit (INDEPENDENT_AMBULATORY_CARE_PROVIDER_SITE_OTHER): Payer: Medicare HMO

## 2023-03-21 DIAGNOSIS — J189 Pneumonia, unspecified organism: Secondary | ICD-10-CM

## 2023-03-21 DIAGNOSIS — R051 Acute cough: Secondary | ICD-10-CM | POA: Diagnosis not present

## 2023-03-21 DIAGNOSIS — R918 Other nonspecific abnormal finding of lung field: Secondary | ICD-10-CM | POA: Diagnosis not present

## 2023-03-21 DIAGNOSIS — R058 Other specified cough: Secondary | ICD-10-CM | POA: Diagnosis not present

## 2023-03-21 DIAGNOSIS — J984 Other disorders of lung: Secondary | ICD-10-CM | POA: Diagnosis not present

## 2023-03-21 MED ORDER — BENZONATATE 100 MG PO CAPS: 100.0000 mg | ORAL_CAPSULE | Freq: Three times a day (TID) | ORAL | 0 refills | Status: DC

## 2023-03-21 MED ORDER — DOXYCYCLINE HYCLATE 100 MG PO CAPS: 100.0000 mg | ORAL_CAPSULE | Freq: Two times a day (BID) | ORAL | 0 refills | Status: AC

## 2023-03-21 MED ORDER — DOXYCYCLINE HYCLATE 100 MG PO CAPS: 100.0000 mg | ORAL_CAPSULE | Freq: Two times a day (BID) | ORAL | 0 refills | Status: DC

## 2023-03-21 NOTE — ED Triage Notes (Signed)
Congestion and coughing up phlegm. Onset 1 week.

## 2023-03-21 NOTE — ED Provider Notes (Signed)
[facility]    CSN: [id number] Arrival date & time: 03/21/23  1043     History   Chief Complaint Chief Complaint  Patient presents with   Nasal Congestion   Cough    HPI [name]. is a 67 y.o. male.  Here with 8-9 day history of nasal congestion and productive cough Congestion improved but cough persisting.  Denies fever or shortness of breath  Has taken dayquil, nyquil, Claritin with minimal relief  Denies lung history  Close sick contact recently, son had a cold No recent travel  Past Medical History:  Diagnosis Date   Arthritis    GERD (gastroesophageal reflux disease)    Hypertension     There are no problems to display for this patient.   Past Surgical History:  Procedure Laterality Date   CHOLECYSTECTOMY     VASECTOMY      Home Medications    Prior to Admission medications   Medication Sig Start Date End Date Taking? Authorizing Provider  atenolol (TENORMIN) 100 MG tablet Take 100 mg by mouth daily.   Yes [provider]  benzonatate (TESSALON) 100 MG capsule Take 1 capsule (100 mg total) by mouth every 8 (eight) hours. 03/21/23  Yes Sanita Estrada, Lurena Joiner, PA-C  doxycycline (VIBRAMYCIN) 100 MG capsule Take 1 capsule (100 mg total) by mouth 2 (two) times daily for 5 days. 03/21/23 03/26/23 Yes Khizar Fiorella, Lurena Joiner, PA-C  esomeprazole (NEXIUM) 40 MG capsule Take 40 mg by mouth daily at 12 noon.   Yes [provider]  Hyoscyamine Sulfate SL (LEVSIN/SL) 0.125 MG SUBL Take 1 to 2 tablets every 6 hours as needed for abdominal pain/cramping 12/25/16  Yes Amyot, Ali Lowe, NP  lisinopril-hydrochlorothiazide (PRINZIDE,ZESTORETIC) 20-12.5 MG tablet Take 1 tablet by mouth daily. 10/28/16  Yes Payton Mccallum, MD  ondansetron (ZOFRAN) 4 MG tablet Take 1 tablet (4 mg total) by mouth every 8 (eight) hours as needed for nausea or vomiting. 12/25/16   Sudie Grumbling, NP  predniSONE (DELTASONE) 10 MG tablet One tablet a day for 5 days 11/01/17   Elson Areas, PA-C    Family History Family History  Problem Relation Age of Onset   Dementia Mother     Social History Social History   Tobacco Use   Smoking status: Every Day    Current packs/day: 1.00    Types: Cigarettes   Smokeless tobacco: Current  Substance Use Topics   Alcohol use: Yes    Comment: occ   Drug use: No     Allergies   Patient has no known allergies.   Review of Systems Review of Systems Per HPI  Physical Exam Triage Vital Signs ED Triage Vitals [03/21/23 1100]  Encounter Vitals Group     BP (!) 153/98     Systolic BP Percentile      Diastolic BP Percentile      Pulse Rate 79     Resp 16     Temp 98 F (36.7 C)     Temp Source Oral     SpO2 94 %     Weight      Height      Head Circumference      Peak Flow      Pain Score      Pain Loc      Pain Education      Exclude from Growth Chart    No data found.  Updated Vital Signs BP (!) 153/98 (BP Location: Left Arm)  Pulse 79   Temp 98 F (36.7 C) (Oral)   Resp 16   SpO2 94%    Physical Exam Vitals and nursing note reviewed.  Constitutional:      General: He is not in acute distress.    Appearance: He is not ill-appearing.  HENT:     Nose: No congestion or rhinorrhea.     Mouth/Throat:     Mouth: Mucous membranes are moist.     Pharynx: Oropharynx is clear. No posterior oropharyngeal erythema.  Eyes:     Conjunctiva/sclera: Conjunctivae normal.  Cardiovascular:     Rate and Rhythm: Normal rate and regular rhythm.     Pulses: Normal pulses.     Heart sounds: Normal heart sounds.  Pulmonary:     Effort: Pulmonary effort is normal.     Breath sounds: Normal breath sounds and air entry. No decreased air movement.     Comments: Dry sounding cough while in clinic. Quiet lung sounds. No wheezing or crackles Musculoskeletal:     Cervical back: Normal range of motion.  Skin:    General: Skin is warm and dry.  Neurological:     Mental Status: He is alert and oriented to  person, place, and time.     UC Treatments / Results  Labs (all labs ordered are listed, but only abnormal results are displayed) Labs Reviewed - No data to display  EKG  Radiology DG Chest 2 View  Result Date: 03/21/2023 CLINICAL DATA:  Productive cough for 8 days. EXAM: CHEST - 2 VIEW COMPARISON:  Two-view chest x-ray 11/01/2017. FINDINGS: Subtle airspace opacities are present in the right middle lobe. The lungs are otherwise clear. The heart size is normal. IMPRESSION: Subtle right middle lobe airspace disease concerning for pneumonia. Electronically Signed   By: Marin Roberts M.D.   On: 03/21/2023 11:52    Procedures Procedures (including critical care time)  Medications Ordered in UC Medications - No data to display  Initial Impression / Assessment and Plan / UC Course  I have reviewed the triage vital signs and the nursing notes.  Pertinent labs & imaging results that were available during my care of the patient were reviewed by me and considered in my medical decision making (see chart for details).  Afebrile, overall well appearing despite cough while in clinic Sating 95% on RA. No respiratory distress  CXR concerning for middle lobe pneumonia Treat with doxy BID x 5 Tessalon TID prn  Follow with PCP. Strict return and ED precautions  Patient agrees to plan, all questions answered   Final Clinical Impressions(s) / UC Diagnoses   Final diagnoses:  Pneumonia of right middle lobe due to infectious organism  Acute cough     Discharge Instructions      I am treating you for pneumonia Please take the doxycycline as prescribed. Take with food to avoid upset stomach.  The tessalon cough pills can be taken 3x daily. If this medication makes you drowsy, take only one pill before bed.  Please contact your primary care provider for follow up.     ED Prescriptions     Medication Sig Dispense Auth. Provider   doxycycline (VIBRAMYCIN) 100 MG capsule Take 1  capsule (100 mg total) by mouth 2 (two) times daily for 5 days. 10 capsule Kieron Kantner, PA-C   benzonatate (TESSALON) 100 MG capsule Take 1 capsule (100 mg total) by mouth every 8 (eight) hours. 20 capsule Husein Guedes, Lurena Joiner, PA-C      PDMP not reviewed  this encounter.   Sreenidhi Ganson, Lurena Joiner, [place] 03/21/23 1230

## 2023-03-21 NOTE — Discharge Instructions (Addendum)
I am treating you for pneumonia Please take the doxycycline as prescribed. Take with food to avoid upset stomach.  The tessalon cough pills can be taken 3x daily. If this medication makes you drowsy, take only one pill before bed.  Please contact your primary care provider for follow up.

## 2023-04-15 DIAGNOSIS — E291 Testicular hypofunction: Secondary | ICD-10-CM | POA: Diagnosis not present

## 2023-04-19 ENCOUNTER — Ambulatory Visit: Payer: Medicare HMO

## 2023-04-19 ENCOUNTER — Ambulatory Visit
Admission: EM | Admit: 2023-04-19 | Discharge: 2023-04-19 | Disposition: A | Discharge status: Home / Self Care | Payer: Medicare HMO | Attending: Internal Medicine | Admitting: Internal Medicine

## 2023-04-19 DIAGNOSIS — B338 Other specified viral diseases: Secondary | ICD-10-CM | POA: Diagnosis not present

## 2023-04-19 DIAGNOSIS — Z1152 Encounter for screening for COVID-19: Secondary | ICD-10-CM | POA: Insufficient documentation

## 2023-04-19 DIAGNOSIS — R5383 Other fatigue: Secondary | ICD-10-CM | POA: Insufficient documentation

## 2023-04-19 DIAGNOSIS — R5381 Other malaise: Secondary | ICD-10-CM | POA: Diagnosis not present

## 2023-04-19 DIAGNOSIS — B349 Viral infection, unspecified: Secondary | ICD-10-CM | POA: Diagnosis not present

## 2023-04-19 MED ORDER — PROMETHAZINE-DM 6.25-15 MG/5ML PO SYRP: 5.0000 mL | ORAL_SOLUTION | Freq: Three times a day (TID) | ORAL | 0 refills | Status: DC | PRN

## 2023-04-19 NOTE — ED Provider Notes (Signed)
[facility]  Note:  This document was prepared using Conservation officer, historic buildings and may include unintentional dictation errors.  MRN: [id number] DOB: August 04, 1955  Subjective:   Lucas Shaffer. is a 67 y.o. male presenting for 1 day history of malaise, fatigue, nausea, sinus headaches, coughing.  A month ago, patient was diagnosed with pneumonia and was treated with only doxycycline.  Believes he achieve resolution.  Has not had follow-up since then.  No current facility-administered medications for this encounter.  Current Outpatient Medications:    atenolol (TENORMIN) 100 MG tablet, Take 100 mg by mouth daily., Disp: , Rfl:    benzonatate (TESSALON) 100 MG capsule, Take 1 capsule (100 mg total) by mouth every 8 (eight) hours., Disp: 20 capsule, Rfl: 0   esomeprazole (NEXIUM) 40 MG capsule, Take 40 mg by mouth daily at 12 noon., Disp: , Rfl:    Hyoscyamine Sulfate SL (LEVSIN/SL) 0.125 MG SUBL, Take 1 to 2 tablets every 6 hours as needed for abdominal pain/cramping, Disp: 20 each, Rfl: 0   lisinopril-hydrochlorothiazide (PRINZIDE,ZESTORETIC) 20-12.5 MG tablet, Take 1 tablet by mouth daily., Disp: 30 tablet, Rfl: 0   ondansetron (ZOFRAN) 4 MG tablet, Take 1 tablet (4 mg total) by mouth every 8 (eight) hours as needed for nausea or vomiting., Disp: 12 tablet, Rfl: 0   predniSONE (DELTASONE) 10 MG tablet, One tablet a day for 5 days, Disp: 5 tablet, Rfl: 0   No Known Allergies  Past Medical History:  Diagnosis Date   Arthritis    GERD (gastroesophageal reflux disease)    Hypertension      Past Surgical History:  Procedure Laterality Date   CHOLECYSTECTOMY     VASECTOMY      Family History  Problem Relation Age of Onset   Dementia Mother     Social History   Tobacco Use   Smoking status: Former    Current packs/day: 1.00    Types: Cigarettes   Smokeless tobacco: Former  Building services engineer status: Never Used  Substance Use Topics    Alcohol use: Yes    Comment: occ   Drug use: No    ROS   Objective:   Vitals: BP (!) 146/86 (BP Location: Right Arm)   Pulse 74   Temp 98 F (36.7 C) (Oral)   Resp 18   SpO2 93%   Physical Exam Constitutional:      General: He is not in acute distress.    Appearance: Normal appearance. He is well-developed and normal weight. He is not ill-appearing, toxic-appearing or diaphoretic.  HENT:     Head: Normocephalic and atraumatic.     Right Ear: Tympanic membrane, ear canal and external ear normal. No drainage, swelling or tenderness. No middle ear effusion. There is no impacted cerumen. Tympanic membrane is not erythematous or bulging.     Left Ear: Tympanic membrane, ear canal and external ear normal. No drainage, swelling or tenderness.  No middle ear effusion. There is no impacted cerumen. Tympanic membrane is not erythematous or bulging.     Nose: Nose normal. No congestion or rhinorrhea.     Mouth/Throat:     Mouth: Mucous membranes are moist.     Pharynx: No oropharyngeal exudate or posterior oropharyngeal erythema.  Eyes:     General: No scleral icterus.       Right eye: No discharge.        Left eye: No discharge.     Extraocular Movements: Extraocular  movements intact.     Conjunctiva/sclera: Conjunctivae normal.  Cardiovascular:     Rate and Rhythm: Normal rate and regular rhythm.     Heart sounds: Normal heart sounds. No murmur heard.    No friction rub. No gallop.  Pulmonary:     Effort: Pulmonary effort is normal. No respiratory distress.     Breath sounds: Normal breath sounds. No stridor. No wheezing, rhonchi or rales.  Musculoskeletal:     Cervical back: Normal range of motion and neck supple. No rigidity. No muscular tenderness.  Neurological:     General: No focal deficit present.     Mental Status: He is alert and oriented to person, place, and time.  Psychiatric:        Mood and Affect: Mood normal.        Behavior: Behavior normal.        Thought  Content: Thought content normal.     Assessment and Plan :   PDMP not reviewed this encounter.  1. Acute viral syndrome   2. Malaise and fatigue    X-ray over-read was pending at time of discharge, recommended follow up with only abnormal results. Otherwise will not call for negative over-read. Patient was in agreement. Will manage for viral illness such as viral URI, viral syndrome, viral rhinitis, COVID-19. Recommended supportive care. Offered scripts for symptomatic relief. Testing is pending. Counseled patient on potential for adverse effects with medications prescribed/recommended today, ER and return-to-clinic precautions discussed, patient verbalized understanding.     Wallis Bamberg, [place] 04/19/23 1517

## 2023-04-19 NOTE — ED Triage Notes (Signed)
Pt c/o fatigue, nausea, HA started yesterday-NAD-steady gait

## 2023-04-19 NOTE — Discharge Instructions (Addendum)
We will notify you of your test results as they arrive and may take between about 24 hours.  I encourage you to sign up for MyChart if you have not already done so as this can be the easiest way for [place] to communicate results to you online or through a phone app.  Generally, we only contact you if it is a positive test result.  In the meantime, if you develop worsening symptoms including fever, chest pain, shortness of breath despite our current treatment plan then please report to the emergency room as this may be a sign of worsening status from possible viral infection.  Otherwise, we will manage this as a viral syndrome. For sore throat or cough try using a honey-based tea. Use 3 teaspoons of honey with juice squeezed from half lemon. Place shaved pieces of ginger into 1/2-1 cup of water and warm over stove top. Then mix the ingredients and repeat every 4 hours as needed. Please take Tylenol '500mg'$ -'650mg'$  every 6 hours for aches and pains, fevers. Hydrate very well with at least 2 liters of water. Eat light meals such as soups to replenish electrolytes and soft fruits, veggies. Start an antihistamine like Zyrtec ('10mg'$  daily) for postnasal drainage, sinus congestion.  You can take this together with pseudoephedrine (Sudafed) at a dose of 30 mg 2-3 times a day as needed for the same kind of congestion.  Use the cough medications as needed.

## 2023-04-20 LAB — SARS CORONAVIRUS 2 (TAT 6-24 HRS): SARS Coronavirus 2: NEGATIVE

## 2023-05-10 DIAGNOSIS — E782 Mixed hyperlipidemia: Secondary | ICD-10-CM | POA: Diagnosis not present

## 2023-05-10 DIAGNOSIS — I10 Essential (primary) hypertension: Secondary | ICD-10-CM | POA: Diagnosis not present

## 2023-05-10 DIAGNOSIS — K219 Gastro-esophageal reflux disease without esophagitis: Secondary | ICD-10-CM | POA: Diagnosis not present

## 2023-05-17 DIAGNOSIS — Z0000 Encounter for general adult medical examination without abnormal findings: Secondary | ICD-10-CM | POA: Diagnosis not present

## 2023-05-17 DIAGNOSIS — Z6831 Body mass index (BMI) 31.0-31.9, adult: Secondary | ICD-10-CM | POA: Diagnosis not present

## 2023-05-17 DIAGNOSIS — R1310 Dysphagia, unspecified: Secondary | ICD-10-CM | POA: Diagnosis not present

## 2023-05-17 DIAGNOSIS — E291 Testicular hypofunction: Secondary | ICD-10-CM | POA: Diagnosis not present

## 2023-05-17 DIAGNOSIS — E782 Mixed hyperlipidemia: Secondary | ICD-10-CM | POA: Diagnosis not present

## 2023-05-17 DIAGNOSIS — I10 Essential (primary) hypertension: Secondary | ICD-10-CM | POA: Diagnosis not present

## 2023-05-21 ENCOUNTER — Ambulatory Visit: Payer: Medicare HMO | Admitting: Internal Medicine

## 2023-06-02 ENCOUNTER — Ambulatory Visit: Payer: Medicare HMO | Admitting: Internal Medicine

## 2023-06-04 ENCOUNTER — Encounter: Payer: Self-pay | Admitting: Gastroenterology

## 2023-06-04 ENCOUNTER — Ambulatory Visit (INDEPENDENT_AMBULATORY_CARE_PROVIDER_SITE_OTHER): Payer: Medicare HMO | Admitting: Gastroenterology

## 2023-06-04 VITALS — BP 114/71 | HR 59 | Temp 98.6°F | Ht 72.0 in | Wt 230.4 lb

## 2023-06-04 DIAGNOSIS — R1310 Dysphagia, unspecified: Secondary | ICD-10-CM | POA: Insufficient documentation

## 2023-06-04 DIAGNOSIS — K219 Gastro-esophageal reflux disease without esophagitis: Secondary | ICD-10-CM | POA: Diagnosis not present

## 2023-06-04 DIAGNOSIS — Z860100 Personal history of colon polyps, unspecified: Secondary | ICD-10-CM

## 2023-06-04 DIAGNOSIS — R1319 Other dysphagia: Secondary | ICD-10-CM

## 2023-06-04 NOTE — Patient Instructions (Signed)
Call with updated medication list as soon as you can. Upper endoscopy planned.

## 2023-06-04 NOTE — Progress Notes (Signed)
GI Office Note    Referring Provider: Richardean Chimera, MD Primary Care Physician:  Richardean Chimera, MD  Primary Gastroenterologist: Roetta Sessions, MD   Chief Complaint   Chief Complaint  Patient presents with   New Patient (Initial Visit)    Pt referred for dysphagia     History of Present Illness   Lucas Shaffer. is a 67 y.o. male presenting today at the request of Daria Pastures, PA-C.  Patient states that he has had issues with dysphagia for over two years but gradually getting worse. Worse with solid foods. Feels food stop in center of chest. Painful when it happens. He takes deep breaths and tries to relax. Finally food will pass. With liquids, he has had episodes that after swallowing something else, he will burp and then the liquid/food comes back up. Chronically on Nexium. Controls heartburn. He states he was seen in [place] couple years back and his esophagus was looked at but nothing was done. He would like to have this evaluated again given progressive symptoms.   He notes ever since his gallbladder was removed he will have alternating constipation/diarrhea. Metamucil seems to help. No nocturnal stools. No melena, brbpr. No abdominal pain. No unintentional weight loss.   Patient will call with updated medication list. He denies any blood thinners, diabetes medications, or injectables.   He has history of colon polyps, due for 3 year follow up colonoscopy in 2026 per patient. We have requested records from [place].    Medications   Current Outpatient Medications  Medication Sig Dispense Refill   aspirin EC 81 MG tablet Take 81 mg by mouth daily. Swallow whole.     atenolol (TENORMIN) 100 MG tablet Take 100 mg by mouth daily.     esomeprazole (NEXIUM) 40 MG capsule Take 40 mg by mouth daily at 12 noon.     lisinopril-hydrochlorothiazide (PRINZIDE,ZESTORETIC) 20-12.5 MG tablet Take 1 tablet by mouth daily. 30 tablet 0   No current facility-administered  medications for this visit.    Allergies   Allergies as of 06/04/2023   (No Known Allergies)    Past Medical History   Past Medical History:  Diagnosis Date   Arthritis    GERD (gastroesophageal reflux disease)    Hypertension     Past Surgical History   Past Surgical History:  Procedure Laterality Date   CHOLECYSTECTOMY     VASECTOMY      Past Family History   Family History  Problem Relation Age of Onset   Dementia Mother     Past Social History   Social History   Socioeconomic History   Marital status: Single    Spouse name: Not on file   Number of children: Not on file   Years of education: Not on file   Highest education level: Not on file  Occupational History   Not on file  Tobacco Use   Smoking status: Former    Current packs/day: 1.00    Types: Cigarettes   Smokeless tobacco: Former  Building services engineer status: Never Used  Substance and Sexual Activity   Alcohol use: Yes    Comment: occ   Drug use: No   Sexual activity: Yes    Birth control/protection: Spermicide  Other Topics Concern   Not on file  Social History Narrative   Not on file   Social Determinants of Health   Financial Resource Strain: Not on file  Food Insecurity: Not on file  Transportation Needs: Not on file  Physical Activity: Not on file  Stress: Not on file  Social Connections: Not on file  Intimate Partner Violence: Not on file    Review of Systems   General: Negative for anorexia, weight loss, fever, chills, fatigue, weakness. Eyes: Negative for vision changes.  ENT: Negative for hoarseness,  nasal congestion. CV: Negative for chest pain, angina, palpitations, dyspnea on exertion, peripheral edema.  Respiratory: Negative for dyspnea at rest, dyspnea on exertion, cough, sputum, wheezing.  GI: See history of present illness. GU:  Negative for dysuria, hematuria, urinary incontinence, urinary frequency, nocturnal urination.  MS: Negative for joint pain, low  back pain.  Derm: Negative for rash or itching.  Neuro: Negative for weakness, abnormal sensation, seizure, frequent headaches, memory loss,  confusion.  Psych: Negative for anxiety, depression, suicidal ideation, hallucinations.  Endo: Negative for unusual weight change.  Heme: Negative for bruising or bleeding. Allergy: Negative for rash or hives.  Physical Exam   BP 114/71   Pulse (!) 59   Temp 98.6 F (37 C)   Ht 6' (1.829 m)   Wt 230 lb 6.4 oz (104.5 kg)   BMI 31.25 kg/m    General: Well-nourished, well-developed in no acute distress.  Head: Normocephalic, atraumatic.   Eyes: Conjunctiva pink, no icterus. Mouth: Oropharyngeal mucosa moist and pink   Neck: Supple without thyromegaly, masses, or lymphadenopathy.  Lungs: Clear to auscultation bilaterally.  Heart: Regular rate and rhythm, no murmurs rubs or gallops.  Abdomen: Bowel sounds are normal, nontender, nondistended, no hepatosplenomegaly or masses,  no abdominal bruits or hernia, no rebound or guarding.  Rectus diastasis Rectal: not performed Extremities: No lower extremity edema. No clubbing or deformities.  Neuro: Alert and oriented x 4 , grossly normal neurologically.  Skin: Warm and dry, no rash or jaundice.   Psych: Alert and cooperative, normal mood and affect.  Labs   None   Imaging Studies   No results found.  Assessment/Plan:   GERD/esophageal dysphagia: may have reflux esophagitis, esophageal stricture or motility disorder -EGD/ED with Dr. Jena Gauss. ASA 2.  I have discussed the risks, alternatives, benefits with regards to but not limited to the risk of reaction to medication, bleeding, infection, perforation and the patient is agreeable to proceed. Written consent to be obtained. -continue Nexium 40mg  daily  H/O colon polyps:  -request records     Leanna Battles. Melvyn Neth, MHS, PA-C [facility]

## 2023-06-08 ENCOUNTER — Other Ambulatory Visit: Payer: Self-pay | Admitting: *Deleted

## 2023-06-08 ENCOUNTER — Encounter: Payer: Self-pay | Admitting: *Deleted

## 2023-06-08 DIAGNOSIS — R1319 Other dysphagia: Secondary | ICD-10-CM

## 2023-07-01 ENCOUNTER — Encounter: Payer: Self-pay | Admitting: Urology

## 2023-07-01 ENCOUNTER — Ambulatory Visit: Payer: Medicare HMO | Admitting: Urology

## 2023-07-01 VITALS — BP 145/78 | HR 58 | Ht 72.0 in | Wt 227.0 lb

## 2023-07-01 DIAGNOSIS — R7989 Other specified abnormal findings of blood chemistry: Secondary | ICD-10-CM | POA: Diagnosis not present

## 2023-07-01 DIAGNOSIS — Z125 Encounter for screening for malignant neoplasm of prostate: Secondary | ICD-10-CM | POA: Diagnosis not present

## 2023-07-01 DIAGNOSIS — N529 Male erectile dysfunction, unspecified: Secondary | ICD-10-CM | POA: Diagnosis not present

## 2023-07-01 NOTE — Progress Notes (Signed)
 Assessment: 1. Low testosterone    2. Organic impotence   3. Prostate cancer screening     Plan: I personally reviewed the patient's chart including provider notes, lab results. AM labs:  testosterone , LH, prolactin, PSA, CBC Diagnosis of low testosterone /hypogonadism and management options discussed.  Specifically, I discussed testosterone  replacement therapy with short acting injections, topical therapy, oral therapy, long-acting injections, and subcutaneous implants.  Potential risk and side effects of testosterone  replacement therapy reviewed.  I advised him that generally testosterone  replacement therapy needs to be continued as the testosterone  level does not return to normal after a short course of therapy. I advised him that he has risk factors for erectile dysfunction including hypertension and hypercholesterolemia.  He also has a history of tobacco use.  He may need additional treatment for erectile dysfunction in addition to testosterone  replacement. Will call with results and to discuss treatment options.  Chief Complaint:  Chief Complaint  Patient presents with   Hypogonadism    History of Present Illness:  Lucas Shaffer. is a 68 y.o. male who is seen in consultation from Elsie Brought, PA-C for evaluation of low testosterone  and erectile dysfunction. He has a history of erectile dysfunction for approximately 2 years.  He has difficulty achieving an adequate erection.  He is able to achieve a partial erection, is able to penetrate and ejaculate.  No pain or curvature with erections.  He does have nocturnal erections as well.  No decrease in his libido.  He has tried some over-the-counter medications with some success.  He has not tried sildenafil or tadalafil due to concerns of side effects.  He also reports feeling tired and fatigued.  He continues to work and is extremely tired at the end of the day.  No muscle loss.  No emotional lability.  Testosterone   level: 7/24 219 11/24 209  He was treated with testosterone  injections every 2 weeks for several months.  He noted some improvement in his symptoms while on the medication.  The injections were discontinued prior to his last testosterone  level.  Past Medical History:  Past Medical History:  Diagnosis Date   Arthritis    GERD (gastroesophageal reflux disease)    H/O adenomatous polyp of colon    last colonoscopy reported to be in 2023 with surveillance advised 2026   Hyperlipidemia    Hypertension     Past Surgical History:  Past Surgical History:  Procedure Laterality Date   CHOLECYSTECTOMY     VASECTOMY      Allergies:  No Known Allergies  Family History:  Family History  Problem Relation Age of Onset   Dementia Mother     Social History:  Social History   Tobacco Use   Smoking status: Former    Current packs/day: 1.00    Types: Cigarettes   Smokeless tobacco: Former  Building Services Engineer status: Never Used  Substance Use Topics   Alcohol  use: Yes    Comment: occ   Drug use: No    Review of symptoms:  Constitutional:  Negative for unexplained weight loss, night sweats, fever, chills ENT:  Negative for nose bleeds, sinus pain, painful swallowing CV:  Negative for chest pain, shortness of breath, exercise intolerance, palpitations, loss of consciousness Resp:  Negative for cough, wheezing, shortness of breath GI:  Negative for nausea, vomiting, diarrhea, bloody stools GU:  Positives noted in HPI; otherwise negative for gross hematuria, dysuria, urinary incontinence Neuro:  Negative for seizures, poor balance, limb weakness, slurred  speech Psych:  Negative for lack of energy, depression, anxiety Endocrine:  Negative for polydipsia, polyuria, symptoms of hypoglycemia (dizziness, hunger, sweating) Hematologic:  Negative for anemia, purpura, petechia, prolonged or excessive bleeding, use of anticoagulants  Allergic:  Negative for difficulty breathing or choking  as a result of exposure to anything; no shellfish allergy; no allergic response (rash/itch) to materials, foods  Physical exam: BP (!) 145/78   Pulse (!) 58   Ht 6' (1.829 m)   Wt 227 lb (103 kg)   BMI 30.79 kg/m  GENERAL APPEARANCE:  Well appearing, well developed, well nourished, NAD HEENT: Atraumatic, Normocephalic, oropharynx clear. NECK: Supple without lymphadenopathy or thyromegaly. LUNGS: Clear to auscultation bilaterally. HEART: Regular Rate and Rhythm without murmurs, gallops, or rubs. ABDOMEN: Soft, non-tender, No Masses. EXTREMITIES: Moves all extremities well.  Without clubbing, cyanosis, or edema. NEUROLOGIC:  Alert and oriented x 3, normal gait, CN II-XII grossly intact.  MENTAL STATUS:  Appropriate. BACK:  Non-tender to palpation.  No CVAT SKIN:  Warm, dry and intact.  GU: Penis:  uncircumcised Meatus: Normal Scrotum: normal, no masses Testis: normal without masses bilateral Prostate: 40 g, NT, no nodules Rectum: Normal tone,  no masses or tenderness    Results: None

## 2023-07-05 ENCOUNTER — Other Ambulatory Visit: Payer: Self-pay

## 2023-07-05 DIAGNOSIS — Z125 Encounter for screening for malignant neoplasm of prostate: Secondary | ICD-10-CM

## 2023-07-05 DIAGNOSIS — E291 Testicular hypofunction: Secondary | ICD-10-CM

## 2023-07-05 DIAGNOSIS — R7989 Other specified abnormal findings of blood chemistry: Secondary | ICD-10-CM | POA: Diagnosis not present

## 2023-07-06 ENCOUNTER — Telehealth: Payer: Self-pay | Admitting: *Deleted

## 2023-07-06 ENCOUNTER — Other Ambulatory Visit: Payer: Self-pay | Admitting: *Deleted

## 2023-07-06 LAB — CBC
Hematocrit: 47.8 % (ref 37.5–51.0)
Hemoglobin: 15.7 g/dL (ref 13.0–17.7)
MCH: 31.3 pg (ref 26.6–33.0)
MCHC: 32.8 g/dL (ref 31.5–35.7)
MCV: 95 fL (ref 79–97)
Platelets: 271 10*3/uL (ref 150–450)
RBC: 5.01 x10E6/uL (ref 4.14–5.80)
RDW: 13.7 % (ref 11.6–15.4)
WBC: 11.2 10*3/uL — ABNORMAL HIGH (ref 3.4–10.8)

## 2023-07-06 LAB — LUTEINIZING HORMONE: LH: 14.3 m[IU]/mL — ABNORMAL HIGH (ref 1.7–8.6)

## 2023-07-06 LAB — TESTOSTERONE: Testosterone: 305 ng/dL (ref 264–916)

## 2023-07-06 LAB — PSA: Prostate Specific Ag, Serum: 0.2 ng/mL (ref 0.0–4.0)

## 2023-07-06 LAB — PROLACTIN: Prolactin: 15.9 ng/mL (ref 3.6–25.2)

## 2023-07-06 NOTE — Telephone Encounter (Signed)
 Pt left vm wanting a return call about upcoming procedure.  Pt had not received his instructions for upcoming procedure on 07/08/23. Went over instructions with pt. He will come by office to pick up copy of instructions and pt was instructed to have lab work done also.

## 2023-07-06 NOTE — Telephone Encounter (Signed)
 [employer] PA:  Approved Authorization #[id number]  Tracking #[id number] Dates of service 07/08/2023 - 10/01/2023

## 2023-07-07 ENCOUNTER — Other Ambulatory Visit (HOSPITAL_COMMUNITY)
Admission: RE | Admit: 2023-07-07 | Discharge: 2023-07-07 | Disposition: A | Discharge status: Home / Self Care | Payer: Medicare HMO | Source: Ambulatory Visit | Attending: Internal Medicine | Admitting: Internal Medicine

## 2023-07-07 DIAGNOSIS — R1319 Other dysphagia: Secondary | ICD-10-CM | POA: Diagnosis not present

## 2023-07-07 LAB — BASIC METABOLIC PANEL
Anion gap: 8 (ref 5–15)
BUN: 22 mg/dL (ref 8–23)
CO2: 28 mmol/L (ref 22–32)
Calcium: 9.6 mg/dL (ref 8.9–10.3)
Chloride: 102 mmol/L (ref 98–111)
Creatinine, Ser: 1.63 mg/dL — ABNORMAL HIGH (ref 0.61–1.24)
GFR, Estimated: 46 mL/min — ABNORMAL LOW (ref >60)
Glucose, Bld: 111 mg/dL — ABNORMAL HIGH (ref 70–99)
Potassium: 4.1 mmol/L (ref 3.5–5.1)
Sodium: 138 mmol/L (ref 135–145)

## 2023-07-07 MED ORDER — TESTOSTERONE 20.25 MG/ACT (1.62%) TD GEL: 4.0000 | Freq: Every day | TRANSDERMAL | 5 refills | Status: AC

## 2023-07-07 NOTE — Addendum Note (Signed)
 Addended by: Milderd Meager on: 07/07/2023 05:31 PM   Modules accepted: Orders

## 2023-07-08 ENCOUNTER — Encounter (HOSPITAL_COMMUNITY): Payer: Self-pay | Admitting: Internal Medicine

## 2023-07-08 ENCOUNTER — Other Ambulatory Visit: Payer: Self-pay

## 2023-07-08 ENCOUNTER — Ambulatory Visit (HOSPITAL_BASED_OUTPATIENT_CLINIC_OR_DEPARTMENT_OTHER): Payer: Medicare HMO | Admitting: Anesthesiology

## 2023-07-08 ENCOUNTER — Ambulatory Visit (HOSPITAL_COMMUNITY): Payer: Medicare HMO | Admitting: Anesthesiology

## 2023-07-08 ENCOUNTER — Encounter (HOSPITAL_COMMUNITY)
Admission: RE | Disposition: A | Discharge status: Home / Self Care | Payer: Self-pay | Source: Home / Self Care | Attending: Internal Medicine

## 2023-07-08 ENCOUNTER — Ambulatory Visit (HOSPITAL_COMMUNITY)
Admission: RE | Admit: 2023-07-08 | Discharge: 2023-07-08 | Disposition: A | Discharge status: Home / Self Care | Payer: Medicare HMO | Attending: Internal Medicine | Admitting: Internal Medicine

## 2023-07-08 DIAGNOSIS — I10 Essential (primary) hypertension: Secondary | ICD-10-CM | POA: Diagnosis not present

## 2023-07-08 DIAGNOSIS — R1314 Dysphagia, pharyngoesophageal phase: Secondary | ICD-10-CM | POA: Insufficient documentation

## 2023-07-08 DIAGNOSIS — R1310 Dysphagia, unspecified: Secondary | ICD-10-CM | POA: Diagnosis not present

## 2023-07-08 DIAGNOSIS — K219 Gastro-esophageal reflux disease without esophagitis: Secondary | ICD-10-CM | POA: Diagnosis not present

## 2023-07-08 DIAGNOSIS — Z87891 Personal history of nicotine dependence: Secondary | ICD-10-CM | POA: Diagnosis not present

## 2023-07-08 HISTORY — PX: MALONEY DILATION: SHX5535

## 2023-07-08 HISTORY — PX: ESOPHAGOGASTRODUODENOSCOPY (EGD) WITH PROPOFOL: SHX5813

## 2023-07-08 LAB — GLUCOSE, CAPILLARY: Glucose-Capillary: 108 mg/dL — ABNORMAL HIGH (ref 70–99)

## 2023-07-08 SURGERY — ESOPHAGOGASTRODUODENOSCOPY (EGD) WITH PROPOFOL
Anesthesia: General

## 2023-07-08 MED ORDER — PROPOFOL 10 MG/ML IV BOLUS
INTRAVENOUS | Status: DC | PRN
  Administered 2023-07-08: 100 mg via INTRAVENOUS
  Administered 2023-07-08: 50 mg via INTRAVENOUS

## 2023-07-08 MED ORDER — LACTATED RINGERS IV SOLN: INTRAVENOUS | Status: DC

## 2023-07-08 MED ORDER — PROPOFOL 500 MG/50ML IV EMUL
INTRAVENOUS | Status: DC | PRN
  Administered 2023-07-08: 150 ug/kg/min via INTRAVENOUS

## 2023-07-08 MED ORDER — LIDOCAINE HCL (PF) 2 % IJ SOLN
INTRAMUSCULAR | Status: DC | PRN
  Administered 2023-07-08: 100 mg via INTRADERMAL

## 2023-07-08 NOTE — Transfer of Care (Signed)
 Immediate Anesthesia Transfer of Care Note  Patient: Lucas Shaffer.  Procedure(s) Performed: ESOPHAGOGASTRODUODENOSCOPY (EGD) WITH PROPOFOL  MALONEY DILATION  Patient Location: Endoscopy Unit  Anesthesia Type:General  Level of Consciousness: drowsy  Airway & Oxygen Therapy: Patient Spontanous Breathing  Post-op Assessment: Report given to RN and Post -op Vital signs reviewed and stable  Post vital signs: Reviewed and stable  Last Vitals:  Vitals Value Taken Time  BP    Temp    Pulse    Resp    SpO2      Last Pain:  Vitals:   07/08/23 0858  TempSrc:   PainSc: 0-No pain      Patients Stated Pain Goal: 3 (07/08/23 0713)  Complications: No notable events documented.

## 2023-07-08 NOTE — Discharge Instructions (Addendum)
 EGD Discharge instructions Please read the instructions outlined below and refer to this sheet in the next few weeks. These discharge instructions provide you with general information on caring for yourself after you leave the hospital. Your doctor may also give you specific instructions. While your treatment has been planned according to the most current medical practices available, unavoidable complications occasionally occur. If you have any problems or questions after discharge, please call your doctor. ACTIVITY You may resume your regular activity but move at a slower pace for the next 24 hours.  Take frequent rest periods for the next 24 hours.  Walking will help expel (get rid of) the air and reduce the bloated feeling in your abdomen.  No driving for 24 hours (because of the anesthesia (medicine) used during the test).  You may shower.  Do not sign any important legal documents or operate any machinery for 24 hours (because of the anesthesia used during the test).  NUTRITION Drink plenty of fluids.  You may resume your normal diet.  Begin with a light meal and progress to your normal diet.  Avoid alcoholic beverages for 24 hours or as instructed by your caregiver.  MEDICATIONS You may resume your normal medications unless your caregiver tells you otherwise.  WHAT YOU CAN EXPECT TODAY You may experience abdominal discomfort such as a feeling of fullness or "gas" pains.  FOLLOW-UP Your doctor will discuss the results of your test with you.  SEEK IMMEDIATE MEDICAL ATTENTION IF ANY OF THE FOLLOWING OCCUR: Excessive nausea (feeling sick to your stomach) and/or vomiting.  Severe abdominal pain and distention (swelling).  Trouble swallowing.  Temperature over 101 F (37.8 C).  Rectal bleeding or vomiting of blood.        Your upper GI tract appeared normal today  Your esophagus was stretched  Will plan to see you back in the office in 4 months   at patient request, call Rollo Linger  at [phone]-  reviewed findings and recommendations

## 2023-07-08 NOTE — Op Note (Signed)
 [facility] Patient Name: Lucas Shaffer Procedure Date: 07/08/2023 8:37 AM MRN: [id number] Date of Birth: April 03, 1956 Attending MD: Lamar Ozell Hollingshead , MD, [id number] CSN: [id number] Age: 68 Admit Type: Outpatient Procedure:                Upper GI endoscopy Indications:              Dysphagia Providers:                Lamar Ozell Hollingshead, MD, Leandrew Edelman RN, RN,                            Dorcas Lenis, Technician Referring MD:              Medicines:                Propofol  per Anesthesia Complications:            No immediate complications. Estimated Blood Loss:     Estimated blood loss: none. Procedure:                Pre-Anesthesia Assessment:                           - Prior to the procedure, a History and Physical                            was performed, and patient medications and                            allergies were reviewed. The patient's tolerance of                            previous anesthesia was also reviewed. The risks                            and benefits of the procedure and the sedation                            options and risks were discussed with the patient.                            All questions were answered, and informed consent                            was obtained. Prior Anticoagulants: The patient has                            taken no anticoagulant or antiplatelet agents. ASA                            Grade Assessment: III - A patient with severe                            systemic disease. After reviewing the risks and  benefits, the patient was deemed in satisfactory                            condition to undergo the procedure.                           After obtaining informed consent, the endoscope was                            passed under direct vision. Throughout the                            procedure, the patient's blood pressure, pulse, and                            oxygen saturations were  monitored continuously. The                            GIF-H190 ([id number]) scope was introduced through the                            mouth, and advanced to the second part of duodenum.                            The upper GI endoscopy was accomplished without                            difficulty. The patient tolerated the procedure                            well. Scope In: 9:03:44 AM Scope Out: 9:10:23 AM Total Procedure Duration: 0 hours 6 minutes 39 seconds  Findings:      The examined esophagus was normal. Gastric cavity empty. Gastric mucosa       appeared normal. The scope was withdrawn. Dilation was performed with a       Maloney dilator with no resistance at 56 Fr. The dilation site was       examined following endoscope reinsertion and showed no change.      The duodenal bulb and second portion of the duodenum were normal. Impression:               - Normal esophagus. Dilated.                           - Normal duodenal bulb and second portion of the                            duodenum.                           - No specimens collected. Moderate Sedation:      Moderate (conscious) sedation was personally administered by an       anesthesia professional. The following parameters were monitored: oxygen       saturation, heart rate, blood pressure, respiratory rate, EKG, adequacy       of  pulmonary ventilation, and response to care. Recommendation:           - Patient has a contact number available for                            emergencies. The signs and symptoms of potential                            delayed complications were discussed with the                            patient. Return to normal activities tomorrow.                            Written discharge instructions were provided to the                            patient.                           - Advance diet as tolerated.                           - Continue present medications.                           -  Return to my office in 4 months. Procedure Code(s):        --- Professional ---                           [phone], Esophagogastroduodenoscopy, flexible,                            transoral; diagnostic, including collection of                            specimen(s) by brushing or washing, when performed                            (separate procedure)                           43450, Dilation of esophagus, by unguided sound or                            bougie, single or multiple passes Diagnosis Code(s):        --- Professional ---                           R13.10, Dysphagia, unspecified CPT copyright 2022 American Medical Association. All rights reserved. The codes documented in this report are preliminary and upon coder review may  be revised to meet current compliance requirements. Lamar HERO. Abdulraheem Pineo, MD Lamar Ozell Hollingshead, MD 07/08/2023 9:21:51 AM This report has been signed electronically. Number of Addenda: 0

## 2023-07-08 NOTE — Anesthesia Preprocedure Evaluation (Addendum)
 Anesthesia Evaluation  Patient identified by MRN, date of birth, ID band Patient awake    Reviewed: Allergy & Precautions, H&P , NPO status , Patient's Chart, lab work & pertinent test results, reviewed documented beta blocker date and time   Airway Mallampati: II  TM Distance: >3 FB Neck ROM: full    Dental  (+) Edentulous Lower, Edentulous Upper, Dental Advisory Given   Pulmonary former smoker   Pulmonary exam normal breath sounds clear to auscultation       Cardiovascular Exercise Tolerance: Good hypertension,  Rhythm:regular Rate:Normal     Neuro/Psych negative neurological ROS  negative psych ROS   GI/Hepatic Neg liver ROS,GERD  ,,  Endo/Other  negative endocrine ROS    Renal/GU negative Renal ROS  negative genitourinary   Musculoskeletal   Abdominal   Peds  Hematology negative hematology ROS (+)   Anesthesia Other Findings   Reproductive/Obstetrics negative OB ROS                             Anesthesia Physical Anesthesia Plan  ASA: 2  Anesthesia Plan: General   Post-op Pain Management:    Induction:   PONV Risk Score and Plan: Propofol  infusion  Airway Management Planned:   Additional Equipment:   Intra-op Plan:   Post-operative Plan:   Informed Consent: I have reviewed the patients History and Physical, chart, labs and discussed the procedure including the risks, benefits and alternatives for the proposed anesthesia with the patient or authorized representative who has indicated his/her understanding and acceptance.     Dental Advisory Given  Plan Discussed with: CRNA  Anesthesia Plan Comments:        Anesthesia Quick Evaluation

## 2023-07-08 NOTE — Anesthesia Procedure Notes (Signed)
 Date/Time: 07/08/2023 8:57 AM  Performed by: Eliodoro Deward FALCON, CRNAPre-anesthesia Checklist: Patient identified, Emergency Drugs available, Suction available and Patient being monitored Patient Re-evaluated:Patient Re-evaluated prior to induction Oxygen Delivery Method: Nasal cannula Induction Type: IV induction Placement Confirmation: positive ETCO2 Comments: Optiflow High Flow Williams O2 used.

## 2023-07-08 NOTE — H&P (Signed)
 @LOGO @   Primary Care Physician:  Jolee Elsie RAMAN, PA Primary Gastroenterologist:  Dr. Shaaron  Pre-Procedure History & Physical: HPI:  Lucas Shaffer. is a 68 y.o. male here for  here for further evaluation of longstanding GERD and esophageal dysphagia.  Past Medical History:  Diagnosis Date   Arthritis    GERD (gastroesophageal reflux disease)    H/O adenomatous polyp of colon    last colonoscopy reported to be in 2023 with surveillance advised 2026   Hyperlipidemia    Hypertension     Past Surgical History:  Procedure Laterality Date   CHOLECYSTECTOMY     VASECTOMY      Prior to Admission medications   Medication Sig Start Date End Date Taking? Authorizing Provider  aspirin EC 81 MG tablet Take 81 mg by mouth daily. Swallow whole.   Yes [provider]  atenolol (TENORMIN) 100 MG tablet Take 100 mg by mouth daily.   Yes [provider]  esomeprazole (NEXIUM) 40 MG capsule Take 40 mg by mouth daily at 12 noon.   Yes [provider]  lisinopril -hydrochlorothiazide  (PRINZIDE ,ZESTORETIC ) 20-12.5 MG tablet Take 1 tablet by mouth daily. 10/28/16  Yes Servando Hire, MD  Testosterone  20.25 MG/ACT (1.62%) GEL Place 4 Pump onto the skin daily. 07/07/23  Yes Stoneking, Adine PARAS., MD    Allergies as of 06/08/2023   (No Known Allergies)    Family History  Problem Relation Age of Onset   Dementia Mother     Social History   Socioeconomic History   Marital status: Single    Spouse name: Not on file   Number of children: Not on file   Years of education: Not on file   Highest education level: Not on file  Occupational History   Not on file  Tobacco Use   Smoking status: Former    Current packs/day: 1.00    Types: Cigarettes   Smokeless tobacco: Former  Building Services Engineer status: Never Used  Substance and Sexual Activity   Alcohol  use: Yes    Comment: occ   Drug use: No   Sexual activity: Yes    Birth control/protection: Spermicide  Other  Topics Concern   Not on file  Social History Narrative   Not on file   Social Drivers of Health   Financial Resource Strain: Not on file  Food Insecurity: Not on file  Transportation Needs: Not on file  Physical Activity: Not on file  Stress: Not on file  Social Connections: Not on file  Intimate Partner Violence: Not on file    Review of Systems: See HPI, otherwise negative ROS  Physical Exam: BP 132/78   Pulse (!) 53   Temp 98.4 F (36.9 C) (Oral)   Resp 16   SpO2 96%  General:   Alert,  Well-developed, well-nourished, pleasant and cooperative in NAD Neck:  Supple; no masses or thyromegaly. No significant cervical adenopathy. Lungs:  Clear throughout to auscultation.   No wheezes, crackles, or rhonchi. No acute distress. Heart:  Regular rate and rhythm; no murmurs, clicks, rubs,  or gallops. Abdomen: Non-distended, normal bowel sounds.  Soft and nontender without appreciable mass or hepatosplenomegaly.  Pulses:  Normal pulses noted. Extremities:  Without clubbing or edema.  Impression/Plan:    68 year old gentleman with history reflux esophagitis now with esophageal dysphagia.  Here for EGD with esophageal dilation as feasible/appropriate per plan. The risks, benefits, limitations, alternatives and imponderables have been reviewed with the patient. Potential for esophageal dilation,  biopsy, etc. have also been reviewed.  Questions have been answered. All parties agreeable.      Notice: This dictation was prepared with Dragon dictation along with smaller phrase technology. Any transcriptional errors that result from this process are unintentional and may not be corrected upon review.

## 2023-07-09 ENCOUNTER — Encounter (HOSPITAL_COMMUNITY): Payer: Self-pay | Admitting: Internal Medicine

## 2023-07-09 NOTE — Anesthesia Postprocedure Evaluation (Signed)
 Anesthesia Post Note  Patient: Lucas Shaffer.  Procedure(s) Performed: ESOPHAGOGASTRODUODENOSCOPY (EGD) WITH PROPOFOL  MALONEY DILATION  Patient location during evaluation: Phase II Anesthesia Type: General Level of consciousness: awake Pain management: pain level controlled Vital Signs Assessment: post-procedure vital signs reviewed and stable Respiratory status: spontaneous breathing and respiratory function stable Cardiovascular status: blood pressure returned to baseline and stable Postop Assessment: no headache and no apparent nausea or vomiting Anesthetic complications: no Comments: Late entry   No notable events documented.   Last Vitals:  Vitals:   07/08/23 0915 07/08/23 0921  BP: (!) 84/40 106/78  Pulse: (!) 46 (!) 46  Resp: 18 18  Temp: 36.9 C   SpO2: 97% 96%    Last Pain:  Vitals:   07/08/23 0921  TempSrc:   PainSc: 0-No pain                 Yvonna JINNY Bosworth

## 2023-07-12 ENCOUNTER — Encounter: Payer: Self-pay | Admitting: Urology

## 2023-07-12 DIAGNOSIS — E291 Testicular hypofunction: Secondary | ICD-10-CM | POA: Insufficient documentation

## 2023-07-16 ENCOUNTER — Telehealth: Payer: Self-pay | Admitting: Urology

## 2023-07-16 NOTE — Telephone Encounter (Signed)
LMOM notifying pt that insurance approved testosterone gel and he will need to pay around $71 a mo for Rx. Told pt to return call if he has any questions.

## 2023-07-16 NOTE — Telephone Encounter (Signed)
Insurance is not covering the gel. What's to know if a script can be sent for the pill.

## 2023-07-30 ENCOUNTER — Emergency Department (HOSPITAL_COMMUNITY): Payer: Medicare HMO

## 2023-07-30 ENCOUNTER — Encounter (HOSPITAL_COMMUNITY): Payer: Self-pay

## 2023-07-30 ENCOUNTER — Other Ambulatory Visit: Payer: Self-pay

## 2023-07-30 ENCOUNTER — Emergency Department (HOSPITAL_COMMUNITY)
Admission: EM | Admit: 2023-07-30 | Discharge: 2023-07-30 | Disposition: A | Discharge status: Home / Self Care | Payer: Medicare HMO | Attending: Emergency Medicine | Admitting: Emergency Medicine

## 2023-07-30 DIAGNOSIS — W19XXXA Unspecified fall, initial encounter: Secondary | ICD-10-CM

## 2023-07-30 DIAGNOSIS — M1811 Unilateral primary osteoarthritis of first carpometacarpal joint, right hand: Secondary | ICD-10-CM | POA: Diagnosis not present

## 2023-07-30 DIAGNOSIS — W108XXA Fall (on) (from) other stairs and steps, initial encounter: Secondary | ICD-10-CM | POA: Insufficient documentation

## 2023-07-30 DIAGNOSIS — S0990XA Unspecified injury of head, initial encounter: Secondary | ICD-10-CM | POA: Diagnosis not present

## 2023-07-30 DIAGNOSIS — Y93K1 Activity, walking an animal: Secondary | ICD-10-CM | POA: Insufficient documentation

## 2023-07-30 DIAGNOSIS — M25561 Pain in right knee: Secondary | ICD-10-CM | POA: Diagnosis not present

## 2023-07-30 DIAGNOSIS — R110 Nausea: Secondary | ICD-10-CM | POA: Diagnosis not present

## 2023-07-30 DIAGNOSIS — S2241XA Multiple fractures of ribs, right side, initial encounter for closed fracture: Secondary | ICD-10-CM | POA: Diagnosis not present

## 2023-07-30 DIAGNOSIS — M1711 Unilateral primary osteoarthritis, right knee: Secondary | ICD-10-CM | POA: Diagnosis not present

## 2023-07-30 DIAGNOSIS — M25531 Pain in right wrist: Secondary | ICD-10-CM | POA: Diagnosis not present

## 2023-07-30 DIAGNOSIS — S199XXA Unspecified injury of neck, initial encounter: Secondary | ICD-10-CM | POA: Diagnosis not present

## 2023-07-30 DIAGNOSIS — S0181XA Laceration without foreign body of other part of head, initial encounter: Secondary | ICD-10-CM | POA: Diagnosis not present

## 2023-07-30 DIAGNOSIS — G4489 Other headache syndrome: Secondary | ICD-10-CM | POA: Diagnosis not present

## 2023-07-30 DIAGNOSIS — I6523 Occlusion and stenosis of bilateral carotid arteries: Secondary | ICD-10-CM | POA: Diagnosis not present

## 2023-07-30 DIAGNOSIS — S01411A Laceration without foreign body of right cheek and temporomandibular area, initial encounter: Secondary | ICD-10-CM | POA: Insufficient documentation

## 2023-07-30 DIAGNOSIS — R001 Bradycardia, unspecified: Secondary | ICD-10-CM | POA: Diagnosis not present

## 2023-07-30 DIAGNOSIS — R58 Hemorrhage, not elsewhere classified: Secondary | ICD-10-CM | POA: Diagnosis not present

## 2023-07-30 DIAGNOSIS — Z23 Encounter for immunization: Secondary | ICD-10-CM | POA: Insufficient documentation

## 2023-07-30 DIAGNOSIS — M4312 Spondylolisthesis, cervical region: Secondary | ICD-10-CM | POA: Diagnosis not present

## 2023-07-30 DIAGNOSIS — S0993XA Unspecified injury of face, initial encounter: Secondary | ICD-10-CM | POA: Diagnosis not present

## 2023-07-30 DIAGNOSIS — M79661 Pain in right lower leg: Secondary | ICD-10-CM | POA: Diagnosis not present

## 2023-07-30 DIAGNOSIS — M25571 Pain in right ankle and joints of right foot: Secondary | ICD-10-CM | POA: Diagnosis not present

## 2023-07-30 MED ORDER — ACETAMINOPHEN 500 MG PO TABS
1000.0000 mg | ORAL_TABLET | Freq: Once | ORAL | Status: AC
  Administered 2023-07-30: 1000 mg via ORAL
  Filled 2023-07-30: qty 2

## 2023-07-30 MED ORDER — LIDOCAINE HCL 2 % IJ SOLN
10.0000 mL | Freq: Once | INTRAMUSCULAR | Status: AC
  Administered 2023-07-30: 200 mg
  Filled 2023-07-30: qty 20

## 2023-07-30 MED ORDER — TETANUS-DIPHTH-ACELL PERTUSSIS 5-2.5-18.5 LF-MCG/0.5 IM SUSY
0.5000 mL | PREFILLED_SYRINGE | Freq: Once | INTRAMUSCULAR | Status: AC
  Administered 2023-07-30: 0.5 mL via INTRAMUSCULAR
  Filled 2023-07-30: qty 0.5

## 2023-07-30 NOTE — ED Triage Notes (Addendum)
Pt to ED via [facility] from home. Pt was going down wooden stairs and fell while trying to walk dog. Neighbor heard pt fall, no LOC. Pt states he was half way down flight of stairs when he slipped. Pt has laceration to above right eye, puncture wound to right cheek, abrasion and swelling to right lower leg. Pt has had a headache and nausea.  Pt does not take blood thinners and is unsure of when last tetanus was.  20g L hand, pt received zofran 4mg  iv pta EMS VS 122/92 58bpm 97% 14RR

## 2023-07-30 NOTE — ED Notes (Signed)
 Pt to radiology.

## 2023-07-30 NOTE — ED Provider Notes (Incomplete)
[place] EMERGENCY DEPARTMENT AT [facility] Provider Note  MDM   HPI/ROS:  Lucas Shaffer is a 68 y.o. male with pertinent past medical history of HTN, age, GERD who presents as an unlevel trauma after a fall down stairs.  Patient reports he was walking downstairs with a dog when it pulled him, causing him to lose his balance.  He fell down 5-10 stairs.  He did not lose consciousness for an unknown period of time.  He states he came to to EMS and his neighbor next to him.  He has been ambulatory since the incident.  He is currently endorsing primarily head pain as well as some mild right wrist pain, right shin pain.  He has additionally developed some right-sided chest pain with palpation but denies any dyspnea.  He additionally denies any new vision changes, numbness, tingling.   Physical exam is notable for: -***  On my initial evaluation, patient is:  -Vital signs stable.*** Patient afebrile***, hemodynamically stable***, and non-toxic appearing.*** -Additional history obtained from ***  This patient's current presentation, including their history and physical exam, is most consistent with ***. Differentials include ***.     Interpretations, interventions, and the patient's course of care are documented below.      ***   Disposition:  {ED Dispo:29898}  Clinical Impression: No diagnosis found.  Rx / DC Orders ED Discharge Orders     None       The plan for this patient was discussed with Dr. ***, who voiced agreement and who oversaw evaluation and treatment of this patient.   Clinical Complexity A medically appropriate history, review of systems, and physical exam was performed.  My independent interpretations of EKG, labs, and radiology are documented in the ED course above.   If decision rules were used in this patient's evaluation, they are listed below.  *** Click here for ABCD2, HEART and other calculatorsREFRESH Note before signing   Patient's  presentation is most consistent with {EM COPA:27473}  Medical Decision Making   HPI/ROS      See MDM section for pertinent HPI and ROS. A complete ROS was performed with pertinent positives/negatives noted above.   Past Medical History:  Diagnosis Date  . Arthritis   . GERD (gastroesophageal reflux disease)   . H/O adenomatous polyp of colon    last colonoscopy reported to be in 2023 with surveillance advised 2026  . Hyperlipidemia   . Hypertension     Past Surgical History:  Procedure Laterality Date  . CHOLECYSTECTOMY    . ESOPHAGOGASTRODUODENOSCOPY (EGD) WITH PROPOFOL N/A 07/08/2023   Procedure: ESOPHAGOGASTRODUODENOSCOPY (EGD) WITH PROPOFOL;  Surgeon: Corbin Ade, MD;  Location: [facility]O SUITE;  Service: Endoscopy;  Laterality: N/A;  8:15 am, asa 2  . MALONEY DILATION N/A 07/08/2023   Procedure: Elease Hashimoto DILATION;  Surgeon: Corbin Ade, MD;  Location: [facility] ENDO SUITE;  Service: Endoscopy;  Laterality: N/A;  . VASECTOMY        Physical Exam   Vitals:   07/30/23 1218 07/30/23 1220  BP: (!) 142/82   Pulse: (!) 58   Resp: 14   Temp: 98 F (36.7 C)   SpO2: 97%   Weight:  103 kg  Height:  6' (1.829 m)    Physical Exam Gen: Alert Head: No skull depressions or lacerations.  ENT: Pupils *** mm equal, round, reactive to light. No conjunctival hemorrhage. No periorbital ecchymoses/racoon eyes or Battle sign bilaterally. Ears atraumatic. No hemotympanum. No nasal septal deviation or  hematoma. Mouth and tongue atraumatic. Trachea midline.  Chest: Clavicles atraumatic, stable to anterior compression without crepitus. Chest wall with symmetric expansion, stable to anterior and lateral compression without crepitus.  Neck: No midline C-spine tenderness, step-offs, or deformities. C-collar in place. CV: RRR. DP, femoral, and radial pulses 2+ and equal bilaterally. Abdomen: Soft, non-distended, non-tender to palpation. No rebound or guarding. No abrasions/contusions.  Back: No  midline T- or L-spine tenderness, step-offs, or deformities. Neuro: Moving all extremities. GCS: 15. 5/5 strength in bilateral upper and lower extremities with sensation intact to light touch throughout.  MSK: Pelvis stable to anterior and lateral compression. Atraumatic with no gross deformities.        Procedures   If procedures were preformed on this patient, they are listed below:  Procedures   Mikeal Hawthorne, MD Emergency Medicine PGY-2   Please note that this documentation was produced with the assistance of voice-to-text technology and may contain errors.

## 2023-07-30 NOTE — Discharge Instructions (Addendum)
You were seen in the emergency department after a fall down stairs.  Test performed while you are here include EKG, x-rays, and CT scans.  These tests showed no traumatic injuries.  We placed 3 stitches in your forehead laceration.  These will need to be removed in about 5 days (around next Wednesday).  You can go to your PCP, urgent care, or return to the emergency department to have them removed.  We additionally placed a small amount of skin glue over the laceration on your cheek.  There is no need to have this removed as it will come off with time.  Please make sure to keep your stitches dry for at least 48 hours.  After that time, you can shower as normal just make sure to not soak the area and to pat dry after washing.  You can take Tylenol and ibuprofen as needed for pain. If you experience significantly increased pain, new vision changes, new numbness or tingling, loss of consciousness, or any other concerns, return to the ED for reevaluation.

## 2023-07-30 NOTE — ED Provider Triage Note (Signed)
Emergency Medicine Provider Triage Evaluation Note  Primitivo Gauze. , a 68 y.o. male  was evaluated in triage.  Pt complains of fall.  Review of Systems  Positive: Headache, loss of consciousness, bleeding, right wrist pain, right shin pain, right-sided chest pain Negative: Nausea, vomiting, preceding symptoms such as fevers, chills, congestion, cough, nausea, or vomiting.  Physical Exam  BP (!) 142/82 (BP Location: Right Arm)   Pulse (!) 58   Temp 98 F (36.7 C)   Resp 14   Ht 6' (1.829 m)   Wt 103 kg   SpO2 97%   BMI 30.79 kg/m  Gen:   Awake, no distress  , laceration to right eyebrow that appears deep.  Hemostatic now.  Pupils symmetric and reactive with normal extraocular movements tenderness to right chest with clear breath sounds.  Tenderness to right wrist and right shin with abrasion Resp:  Normal effort , clear breath sounds bilaterally MSK:   Moves extremities without difficulty , abrasion and tenderness to right shin, tenderness to right wrist but good grip strength and no snuffbox tenderness.  No laceration to the hand seen.  Laceration to right eyebrow. Other:  Tenderness to right chest  Medical Decision Making  Medically screening exam initiated at 2:15 PM.  Appropriate orders placed.  Primitivo Gauze. was informed that the remainder of the evaluation will be completed by another provider, this initial triage assessment does not replace that evaluation, and the importance of remaining in the ED until their evaluation is complete.  Caine Barfield. is a 68 y.o. male with a past medical history significant for hypertension, hyperlipidemia, and GERD who presents with fall downstairs.  Cording to patient, he was dog sitting in a large dog pulled him and his little dog down about 6 stairs.  Patient tumbled down.  He did lose consciousness.  He reported injury to his right forehead with laceration and loss of consciousness.  Also has some soreness in his neck.  Tenderness in  his right chest but no shortness of breath.  Abdomen nontender.  Hips nontender.  Complaining of abrasion and pain to his right shin.  Pain in his right knee as well.  Tenderness in his right wrist but no laceration seen.  On exam, lungs clear but chest is tender on the right side.  Abdomen nontender.  Back nontender.  Laceration to his right eyebrow but pupils are symmetric and reactive with normal extraocular movements.  Symmetric smile.  Clear speech.  No snuffbox tenderness on right thumb area but did have some tenderness to the wrist.  Good grip strength and pulses.  Lower legs had abrasion to his right shin with swelling and tenderness.  No deep laceration seen initially.  Good pulses.  Will get CT of the head, neck, x-ray of the chest and right wrist and x-ray of the right lower leg.  Anticipate evaluation and management when he is seen in an exam room.      Jelina Paulsen, Canary Brim, MD 07/30/23 [phone]

## 2023-07-30 NOTE — ED Notes (Signed)
Discharge instructions reviewed with patient. Patient questions answered and opportunity for education reviewed. Patient voices understanding of discharge instructions with no further questions. Patient ambulatory with steady gait to lobby.  

## 2023-08-03 ENCOUNTER — Encounter (HOSPITAL_BASED_OUTPATIENT_CLINIC_OR_DEPARTMENT_OTHER): Payer: Self-pay | Admitting: Emergency Medicine

## 2023-08-03 ENCOUNTER — Emergency Department (HOSPITAL_BASED_OUTPATIENT_CLINIC_OR_DEPARTMENT_OTHER)
Admission: EM | Admit: 2023-08-03 | Discharge: 2023-08-03 | Disposition: A | Discharge status: Home / Self Care | Payer: Medicare HMO | Attending: Emergency Medicine | Admitting: Emergency Medicine

## 2023-08-03 ENCOUNTER — Other Ambulatory Visit: Payer: Self-pay

## 2023-08-03 DIAGNOSIS — W19XXXA Unspecified fall, initial encounter: Secondary | ICD-10-CM | POA: Diagnosis not present

## 2023-08-03 DIAGNOSIS — S0990XA Unspecified injury of head, initial encounter: Secondary | ICD-10-CM | POA: Insufficient documentation

## 2023-08-03 MED ORDER — IBUPROFEN 800 MG PO TABS: 800.0000 mg | ORAL_TABLET | Freq: Three times a day (TID) | ORAL | 0 refills | Status: AC

## 2023-08-03 MED ORDER — ONDANSETRON HCL 4 MG PO TABS: 4.0000 mg | ORAL_TABLET | Freq: Three times a day (TID) | ORAL | 0 refills | Status: DC | PRN

## 2023-08-03 NOTE — Discharge Instructions (Signed)
 Today you were seen for a minor head injury.  Please pick up your Motrin  and Zofran  and take for pain and nausea as needed.  Please follow-up with your primary care physician if your symptoms persist.  Thank you for letting us  treat you today. After performing a physical exam, I feel you are safe to go home. Please follow up with your PCP in the next several days and provide them with your records from this visit. Return to the Emergency Room if pain becomes severe or symptoms worsen.

## 2023-08-03 NOTE — ED Provider Notes (Signed)
 [place] EMERGENCY DEPARTMENT AT [facility] Provider Note   CSN: [id number] Arrival date & time: 08/03/23  1303     History  Chief Complaint  Patient presents with   Head Injury    Lucas Shaffer. is a 68 y.o. male today for headache and nausea.  Patient fell on Friday and was seen at [facility], ED. Patient had negative scans at that time and laceration repair to the patient's cheek and forehead.  Patient states that he also has increased fatigue.  Patient denies weakness, numbness, new trauma, fever, neck pain or blurred vision.   Head Injury Associated symptoms: headache and nausea        Home Medications Prior to Admission medications   Medication Sig Start Date End Date Taking? Authorizing Provider  ibuprofen  (ADVIL ) 800 MG tablet Take 1 tablet (800 mg total) by mouth 3 (three) times daily. 08/03/23  Yes Shawnna Pancake N, PA-C  ondansetron  (ZOFRAN ) 4 MG tablet Take 1 tablet (4 mg total) by mouth every 8 (eight) hours as needed for nausea or vomiting. 08/03/23  Yes Jud Fanguy N, PA-C  aspirin EC 81 MG tablet Take 81 mg by mouth daily. Swallow whole.    [provider]  atenolol (TENORMIN) 100 MG tablet Take 100 mg by mouth daily.    [provider]  esomeprazole (NEXIUM) 40 MG capsule Take 40 mg by mouth daily at 12 noon.    [provider]  lisinopril -hydrochlorothiazide  (PRINZIDE ,ZESTORETIC ) 20-12.5 MG tablet Take 1 tablet by mouth daily. 10/28/16   Servando Hire, MD  Testosterone  20.25 MG/ACT (1.62%) GEL Place 4 Pump onto the skin daily. 07/07/23   Stoneking, Adine PARAS., MD      Allergies    Patient has no known allergies.    Review of Systems   Review of Systems  Gastrointestinal:  Positive for nausea.  Neurological:  Positive for headaches.    Physical Exam Updated Vital Signs BP 134/82 (BP Location: Left Arm)   Pulse (!) 57   Temp 98.3 F (36.8 C)   Resp 17   SpO2 95%  Physical Exam Vitals and nursing note reviewed.   Constitutional:      General: He is not in acute distress.    Appearance: Normal appearance. He is well-developed. He is not ill-appearing or toxic-appearing.  HENT:     Head: Normocephalic and atraumatic.     Right Ear: External ear normal.     Left Ear: External ear normal.     Nose: Nose normal.     Mouth/Throat:     Mouth: Mucous membranes are moist.     Pharynx: Oropharynx is clear.  Eyes:     Extraocular Movements: Extraocular movements intact.     Conjunctiva/sclera: Conjunctivae normal.     Pupils: Pupils are equal, round, and reactive to light.  Cardiovascular:     Rate and Rhythm: Normal rate and regular rhythm.     Pulses: Normal pulses.     Heart sounds: Normal heart sounds. No murmur heard. Pulmonary:     Effort: Pulmonary effort is normal. No respiratory distress.     Breath sounds: Normal breath sounds.  Abdominal:     Palpations: Abdomen is soft.     Tenderness: There is no abdominal tenderness.  Musculoskeletal:        General: No swelling.     Cervical back: Normal range of motion and neck supple. No rigidity.     Right lower leg: No edema.  Left lower leg: No edema.  Skin:    General: Skin is warm and dry.     Capillary Refill: Capillary refill takes less than 2 seconds.     Comments: Well healing laceration on patient's forehead and cheek  Neurological:     General: No focal deficit present.     Mental Status: He is alert.     Motor: No weakness.  Psychiatric:        Mood and Affect: Mood normal.     ED Results / Procedures / Treatments   Labs (all labs ordered are listed, but only abnormal results are displayed) Labs Reviewed - No data to display  EKG None  Radiology No results found.  Procedures Procedures    Medications Ordered in ED Medications - No data to display  ED Course/ Medical Decision Making/ A&P                                 Medical Decision Making  This patient presents to the ED with chief complaint(s) of  headache and nausea with pertinent past medical history of fall which further complicates the presenting complaint. The complaint involves an extensive differential diagnosis and also carries with it a high risk of complications and morbidity.    The differential diagnosis includes brain bleed, head injury, musculoskeletal pain, C-spine injury  Additional history obtained: Records reviewed Care Everywhere/External Records  ED Course and Reassessment:   Consultation: - Consulted or discussed management/test interpretation w/ external professional: None  Consideration for admission or further workup: Considered for admission or further workup however patient's vital signs and physical exam are reassuring.  Patient likely due to minor head injury.  Patient's previous scans showed no acute C-spine injury or brain bleed.  Patient given Motrin  and Zofran  for pain and nausea as needed.  Patient to follow-up with primary care physician in the upcoming week for further evaluation and treatment if symptoms persist.        Final Clinical Impression(s) / ED Diagnoses Final diagnoses:  Minor head injury, initial encounter    Rx / DC Orders ED Discharge Orders          Ordered    ibuprofen  (ADVIL ) 800 MG tablet  3 times daily        08/03/23 1539    ondansetron  (ZOFRAN ) 4 MG tablet  Every 8 hours PRN        08/03/23 1539              Francis Ileana SAILOR, PA-C 08/03/23 1544    Pamella Ozell LABOR, DO 08/07/23 [phone]

## 2023-08-03 NOTE — ED Triage Notes (Addendum)
Fall on Friday, seen at [facility] neg scans Lac to head Continues to have headache and nausea

## 2023-08-10 DIAGNOSIS — Z4802 Encounter for removal of sutures: Secondary | ICD-10-CM | POA: Diagnosis not present

## 2023-08-10 DIAGNOSIS — S060X0A Concussion without loss of consciousness, initial encounter: Secondary | ICD-10-CM | POA: Diagnosis not present

## 2023-08-10 DIAGNOSIS — Z6830 Body mass index (BMI) 30.0-30.9, adult: Secondary | ICD-10-CM | POA: Diagnosis not present

## 2023-08-10 DIAGNOSIS — F0781 Postconcussional syndrome: Secondary | ICD-10-CM | POA: Diagnosis not present

## 2023-08-10 DIAGNOSIS — R42 Dizziness and giddiness: Secondary | ICD-10-CM | POA: Diagnosis not present

## 2023-08-19 ENCOUNTER — Telehealth: Payer: Self-pay | Admitting: Family Medicine

## 2023-08-19 NOTE — Telephone Encounter (Signed)
Scheduled 08/27/23 per pt request.

## 2023-08-19 NOTE — Telephone Encounter (Signed)
Left message for patient to call back to schedule. Patient was referred to Neurology for concussion. Called patient to confirm was he was being referred for and to schedule with our office ([facility]) if he would like.

## 2023-08-23 NOTE — Progress Notes (Unsigned)
 Subjective:   I, Lucas Riser, PhD, LAT, ATC acting as a scribe for Lucas Graham, MD.  Chief Complaint: Lucas Shaffer.,  is a 68 y.o. male who presents for initial evaluation of a head injury. Pt going down the stairs, his dog pulled him, and falling down 5-10 stairs, hitting his head. He was transported by EMS to the [facility] ED. Pt was seen again at the ED on 08/03/23.  Today, pt c/o cont'd HA, eye strain, lightheadedness resulting in nausea, and forgetfulness. 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   Injury date: 07/30/23 Visit #: 1  History of Present Illness:   Concussion Self-Reported Symptom Score Symptoms rated on a scale 1-6, in last 24 hours  Headache: 1   Pressure in head: 2 Neck pain: 0 Nausea or vomiting: 2 Dizziness: 2  Blurred vision: 0  Balance problems: 0 Sensitivity to light:  1 Sensitivity to noise: 0 Feeling slowed down: 3 Feeling like "in a fog": 2 "Don't feel right": 4 Difficulty concentrating: 3 Difficulty remembering: 4 Fatigue or low energy: 5 Confusion: 2 Drowsiness: 4 More emotional: 0 Irritability: 3 Sadness: 1 Nervous or anxious: 2 Trouble falling asleep: 0   Total # of Symptoms: 16/22 Total Symptom Score: 41/132  Tinnitus: No  Review of Systems: No fevers or chills    Review of History: Hypogonadism.  History of concussion years ago.  Objective:    Physical Examination Vitals:   08/27/23 0825  BP: 108/72  Pulse: (!) 52  SpO2: 97%   MSK: C-spine normal cervical motion.  Healing scar right forehead Neuro: Alert and oriented normal coordination.  Slight tremor bilaterally. Psych: Normal speech thought process and affect.  A bit tangential.     Imaging:  CT Cervical Spine Wo Contrast Result Date: 07/30/2023 CLINICAL DATA:  Facial trauma, blunt EXAM: CT CERVICAL SPINE WITHOUT CONTRAST TECHNIQUE: Multidetector CT imaging of the cervical spine was performed without intravenous contrast.  Multiplanar CT image reconstructions were also generated. RADIATION DOSE REDUCTION: This exam was performed according to the departmental dose-optimization program which includes automated exposure control, adjustment of the mA and/or kV according to patient size and/or use of iterative reconstruction technique. COMPARISON:  None Available. FINDINGS: Alignment: Facet joints are aligned without dislocation or traumatic listhesis. Dens and lateral masses are aligned. Degenerative grade 1 anterolisthesis of C4 on C5. Skull base and vertebrae: No acute fracture. No primary bone lesion or focal pathologic process. Soft tissues and spinal canal: No prevertebral fluid or swelling. No visible canal hematoma. Disc levels:  Facet predominant multilevel degenerative spondylosis. Upper chest: Included lung apices are clear. Other: Bilateral carotid atherosclerosis. IMPRESSION: No acute fracture or traumatic listhesis of the cervical spine. Electronically Signed   By: Duanne Guess D.O.   On: 07/30/2023 16:07   CT Head Wo Contrast Result Date: 07/30/2023 CLINICAL DATA:  Facial trauma, blunt EXAM: CT HEAD WITHOUT CONTRAST TECHNIQUE: Contiguous axial images were obtained from the base of the skull through the vertex without intravenous contrast. RADIATION DOSE REDUCTION: This exam was performed according to the departmental dose-optimization program which includes automated exposure control, adjustment of the mA and/or kV according to patient size and/or use of iterative reconstruction technique. COMPARISON:  None Available. FINDINGS: Brain: No evidence of acute infarction, hemorrhage, hydrocephalus, extra-axial collection or mass lesion/mass effect. Minimal low-density changes within the periventricular and subcortical white matter most compatible with chronic microvascular ischemic change. Mild diffuse cerebral volume loss. Vascular: Atherosclerotic calcifications involving  the large vessels of the skull base. No  unexpected hyperdense vessel. Skull: Normal. Negative for fracture or focal lesion. Sinuses/Orbits: No acute finding. Other: Soft tissue laceration overlying the anterior right frontal region. Degenerative changes of the left temporomandibular joint. IMPRESSION: 1. No acute intracranial findings. 2. Soft tissue laceration overlying the anterior right frontal region. No underlying calvarial fracture. Electronically Signed   By: Duanne Guess D.O.   On: 07/30/2023 16:05   DG Wrist Complete Right Result Date: 07/30/2023 CLINICAL DATA:  Pain status post fall. EXAM: RIGHT WRIST - COMPLETE 3+ VIEW COMPARISON:  None Available. FINDINGS: There is no evidence of fracture or dislocation. The carpal rows are intact and demonstrate normal alignment. Severe degenerative changes of the first [facility] joint manifested by joint space narrowing, subchondral sclerosis, and marginal osteophytosis. No significant soft tissue abnormalities are seen. IMPRESSION: 1. No acute osseous abnormality. 2. 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 Electronically Signed   By: Hart Robinsons M.D.   On: 07/30/2023 15:54   DG Ribs Unilateral W/Chest Right Result Date: 07/30/2023 CLINICAL DATA:  Fall. EXAM: RIGHT RIBS AND CHEST - 3+ VIEW COMPARISON:  Chest radiograph dated April 19, 2023. FINDINGS: No acute osseous abnormality is identified. No obvious displaced rib fracture. There is no evidence of pneumothorax or pleural effusion. Both lungs are clear. Heart size and mediastinal contours are within normal limits. IMPRESSION: 1. No obvious displaced rib fracture identified. Should the patient's symptoms persist or worsen, repeat radiographs of the ribs in 10 - 14 days maybe of use to detect subtle nondisplaced rib fractures (which are commonly occult on initial imaging). 2. No acute cardiopulmonary findings. Electronically Signed   By: Hart Robinsons M.D.   On: 07/30/2023 15:51   DG Knee Complete 4 Views Right Result Date:  07/30/2023 CLINICAL DATA:  Pain status post fall. EXAM: RIGHT KNEE - COMPLETE 4+ VIEW; RIGHT TIBIA AND FIBULA - 2 VIEW; RIGHT ANKLE - COMPLETE 3+ VIEW COMPARISON:  None Available. FINDINGS: No evidence of acute fracture, dislocation, or significant joint effusion. Mild tricompartmental degenerative changes of the knee. Ankle mortise is congruent. Calcaneal enthesopathy at the insertion of the Achilles tendon and the origin of the central cord of the plantar fascia. Soft tissue swelling at the proximal anterior shin. No radiopaque foreign body. IMPRESSION: 1. No acute osseous abnormality. 2. Soft tissue swelling at the proximal anterior shin. No radiopaque foreign body. Electronically Signed   By: Hart Robinsons M.D.   On: 07/30/2023 15:47   DG Tibia/Fibula Right Result Date: 07/30/2023 CLINICAL DATA:  Pain status post fall. EXAM: RIGHT KNEE - COMPLETE 4+ VIEW; RIGHT TIBIA AND FIBULA - 2 VIEW; RIGHT ANKLE - COMPLETE 3+ VIEW COMPARISON:  None Available. FINDINGS: No evidence of acute fracture, dislocation, or significant joint effusion. Mild tricompartmental degenerative changes of the knee. Ankle mortise is congruent. Calcaneal enthesopathy at the insertion of the Achilles tendon and the origin of the central cord of the plantar fascia. Soft tissue swelling at the proximal anterior shin. No radiopaque foreign body. IMPRESSION: 1. No acute osseous abnormality. 2. Soft tissue swelling at the proximal anterior shin. No radiopaque foreign body. Electronically Signed   By: Hart Robinsons M.D.   On: 07/30/2023 15:47   DG Ankle Complete Right Result Date: 07/30/2023 CLINICAL DATA:  Pain status post fall. EXAM: RIGHT KNEE - COMPLETE 4+ VIEW; RIGHT TIBIA AND FIBULA - 2 VIEW; RIGHT ANKLE - COMPLETE 3+ VIEW COMPARISON:  None Available. FINDINGS: No evidence of acute fracture,  dislocation, or significant joint effusion. Mild tricompartmental degenerative changes of the knee. Ankle mortise is congruent. Calcaneal  enthesopathy at the insertion of the Achilles tendon and the origin of the central cord of the plantar fascia. Soft tissue swelling at the proximal anterior shin. No radiopaque foreign body. IMPRESSION: 1. No acute osseous abnormality. 2. Soft tissue swelling at the proximal anterior shin. No radiopaque foreign body. Electronically Signed   By: Hart Robinsons M.D.   On: 07/30/2023 15:47   I, Lucas Shaffer, personally (independently) visualized and performed the interpretation of the images attached in this note.   Assessment and Plan   68 y.o. male with concussion occurring about a month ago.  Dominant problem now is memory and concentration and word finding.  He is feeling well enough that he has returned to work.  We talked about options and we will plan for speech therapy referral for cognitive rehab.  Recheck in a month.  Contact me or return sooner if needed.       Action/Discussion: Reviewed diagnosis, management options, expected outcomes, and the reasons for scheduled and emergent follow-up. Questions were adequately answered. Patient expressed verbal understanding and agreement with the following plan.     Patient Education: Reviewed with patient the risks (i.e, a repeat concussion, post-concussion syndrome, second-impact syndrome) of returning to play prior to complete resolution, and thoroughly reviewed the signs and symptoms of concussion.Reviewed need for complete resolution of all symptoms, with rest AND exertion, prior to return to play. Reviewed red flags for urgent medical evaluation: worsening symptoms, nausea/vomiting, intractable headache, musculoskeletal changes, focal neurological deficits. [facility] Con[facility]ussion Care Plan, which clearly outlines the plans stated above, was given to patient.   Level of service: Total encounter time 30 minutes including face-to-face time with the patient and, reviewing past medical record, and charting on the date of service.         After Visit Summary printed out and provided to patient as appropriate.  The above documentation has been reviewed and is accurate and complete Lucas Shaffer

## 2023-08-27 ENCOUNTER — Ambulatory Visit: Payer: Medicare HMO | Admitting: Family Medicine

## 2023-08-27 VITALS — BP 108/72 | HR 52 | Ht 72.0 in | Wt 227.0 lb

## 2023-08-27 DIAGNOSIS — S060X9A Concussion with loss of consciousness of unspecified duration, initial encounter: Secondary | ICD-10-CM | POA: Diagnosis not present

## 2023-08-27 NOTE — Patient Instructions (Addendum)
 Thank you for coming in today.   Plan for speech therapy for cognitive rehab  Recheck with me in 1 month.   Let me know or return sooner if you have problem.

## 2023-09-03 ENCOUNTER — Ambulatory Visit: Attending: Family Medicine

## 2023-09-03 ENCOUNTER — Other Ambulatory Visit: Payer: Self-pay

## 2023-09-03 DIAGNOSIS — S060X9A Concussion with loss of consciousness of unspecified duration, initial encounter: Secondary | ICD-10-CM | POA: Diagnosis not present

## 2023-09-03 DIAGNOSIS — R41841 Cognitive communication deficit: Secondary | ICD-10-CM | POA: Insufficient documentation

## 2023-09-03 NOTE — Therapy (Signed)
 OUTPATIENT SPEECH LANGUAGE PATHOLOGY EVALUATION   Patient Name: Lucas Shaffer. MRN: [id number] DOB:[birth year], 68 y.o., male Today's Date: 09/03/2023  PCP: Daria Pastures, PA REFERRING PROVIDER: Rodolph Bong, MD  END OF SESSION:  End of Session - 09/03/23 1033     Visit Number 1    Number of Visits 17    Date for SLP Re-Evaluation 11/05/23   extended one week due to scheduling   SLP Start Time 0849    SLP Stop Time  0930    SLP Time Calculation (min) 41 min    Activity Tolerance Patient tolerated treatment well             Past Medical History:  Diagnosis Date   Arthritis    GERD (gastroesophageal reflux disease)    H/O adenomatous polyp of colon    last colonoscopy reported to be in 2023 with surveillance advised 2026   Hyperlipidemia    Hypertension    Past Surgical History:  Procedure Laterality Date   CHOLECYSTECTOMY     ESOPHAGOGASTRODUODENOSCOPY (EGD) WITH PROPOFOL N/A 07/08/2023   Procedure: ESOPHAGOGASTRODUODENOSCOPY (EGD) WITH PROPOFOL;  Surgeon: Corbin Ade, MD;  Location: [facility];  Service: Endoscopy;  Laterality: N/A;  8:15 am, asa 2   MALONEY DILATION N/A 07/08/2023   Procedure: MALONEY DILATION;  Surgeon: Corbin Ade, MD;  Location: [facility];  Service: Endoscopy;  Laterality: N/A;   VASECTOMY     Patient Active Problem List   Diagnosis Date Noted   Hypogonadism in male 07/12/2023   Low testosterone 07/01/2023   Organic impotence 07/01/2023   Prostate cancer screening 07/01/2023   Dysphagia 06/04/2023   Ureterolithiasis 01/23/2022   Bilateral carpal tunnel syndrome 01/20/2022   Essential tremor 01/06/2017   Essential (primary) hypertension 01/06/2017   Gastro-esophageal reflux disease without esophagitis 01/06/2017   Tobacco abuse 01/06/2017    ONSET DATE: 07/30/23   REFERRING DIAG:  N82.0X9A (ICD-10-CM) - Concussion with loss of consciousness, initial encounter    THERAPY DIAG:  Cognitive communication  deficit  Rationale for Evaluation and Treatment: Rehabilitation  SUBJECTIVE:   SUBJECTIVE STATEMENT: "There was an incident at work the other day and I couldn't remember it. Once I did I figured what I needed to do but it shouldn't have taken me that long to know what I was going to do."  Pt accompanied by: self  PERTINENT HISTORY:  Pt going down the stairs, his dog pulled him, and falling down 5-10 stairs, hitting his head. He was transported by EMS to the [facility] ED. Pt was seen again at the ED on 08/03/23.  PAIN:  Are you having pain? Yes: NPRS scale: 2/10 Pain location: rt lateral head Pain description: dull  FALLS: Has patient fallen in last 6 months?  Yes, Number of falls: 1  LIVING ENVIRONMENT: Lives with: lives with an adult companion Lives in: House/apartment  PLOF:  Level of assistance: Independent with ADLs, Independent with IADLs Employment: Full-time employment  PATIENT GOALS: Improve in deficit areas  OBJECTIVE:  Note: Objective measures were completed at Evaluation unless otherwise noted.  DIAGNOSTIC FINDINGS:  CT Head Wo Contrast Result Date: 07/30/2023 CLINICAL DATA:  Facial trauma, blunt EXAM: CT HEAD WITHOUT CONTRAST TECHNIQUE: Contiguous axial images were obtained from the base of the skull through the vertex without intravenous contrast. RADIATION DOSE REDUCTION: This exam was performed according to the departmental dose-optimization program which includes automated exposure control, adjustment of the mA and/or kV according to patient size and/or use  of iterative reconstruction technique. COMPARISON:  None Available. FINDINGS: Brain: No evidence of acute infarction, hemorrhage, hydrocephalus, extra-axial collection or mass lesion/mass effect. Minimal low-density changes within the periventricular and subcortical white matter most compatible with chronic microvascular ischemic change. Mild diffuse cerebral volume loss. Vascular: Atherosclerotic calcifications  involving the large vessels of the skull base. No unexpected hyperdense vessel. Skull: Normal. Negative for fracture or focal lesion. Sinuses/Orbits: No acute finding. Other: Soft tissue laceration overlying the anterior right frontal region. Degenerative changes of the left temporomandibular joint. IMPRESSION: 1. No acute intracranial findings. 2. Soft tissue laceration overlying the anterior right frontal region. No underlying calvarial fracture.  COGNITION: Overall cognitive status: Impaired Areas of impairment:  Memory: Impaired: Working IT consultant function: Impaired: Slow processing Functional deficits: see "S" statement. Pt cannot recall if he has taken his meds or not.  COGNITIVE COMMUNICATION: Following directions: Follows multi-step commands consistently  Auditory comprehension: WFL Verbal expression: WFL Functional communication: Impaired: pt gives example of he has difficulty communicating ideas due to slower overall processing  ORAL MOTOR EXAMINATION: Overall status: WFL   STANDARDIZED ASSESSMENTS: CLQT: Initiated today. Difficulty with attention to detail, as well as processing of information.  PATIENT REPORTED OUTCOME MEASURES (PROM): Cognitive Function: to be administered at first 1-2 sessions                                                                                                                            TREATMENT DATE:   09/03/23 (eval date): n/a  PATIENT EDUCATION: Education details: SLP role in ST to provide and assist with compensatory strategies and to work with pt's decr'd processing Person educated: Patient Education method: Explanation Education comprehension: verbalized understanding   GOALS: Goals reviewed with patient? Yes, in general  SHORT TERM GOALS: Target date: 10/01/23  Zerek will complete CLQT in first therapy session Baseline: Goal status: INITIAL  2.   Pt will tell SLP 3 trained compensatory strategies he could use for  cognitive communication deficits Baseline:  Goal status: INITIAL  3.   Pt will demo use of one trained compensatory strategy, with min A to use, in 2 sessions Baseline:  Goal status: INITIAL  4.  Pt will complete functional detailed linguistic tasks with 100% success after self-correction and rare min questioning cues in 3 sessions Baseline:  Goal status: INITIAL    LONG TERM GOALS: Target date: 11/05/23  Durand will improve PROM score compared to initial administration Baseline:  Goal status: INITIAL  2.  Pt will demo use of two trained compensatory strategies in 2 sessions Baseline:  Goal status: INITIAL  3.  Pt will complete functional mod complex linguistic tasks with 100% success after self-correction in 3 sessions Baseline:  Goal status: INITIAL  4.  Pt will track medication administration correctly for 14 consecutive days Baseline:  Goal status: INITIAL  ASSESSMENT:  CLINICAL IMPRESSION: Patient is a 68 y.o. M who was seen today for assessment of cognitive communication skills following  a concussive event on 07/30/23. SLP initiated the CLQT and this will be completed next session and more goals added PRN. Today he demonstrated decr'd memory skills, decr'd attention to detail, and decr'd overall processing speed. Today Carvell gave example difficulty communicating ideas due to slower overall processing at work, decr'd memory at work, and decr'd memory at home (cannot recall if he's taken his meds).  OBJECTIVE IMPAIRMENTS: include attention, memory, and executive functioning. These impairments are limiting patient from managing medications, household responsibilities, ADLs/IADLs, and effectively communicating at home and in community. Factors affecting potential to achieve goals and functional outcome are  none noted today .Marland Kitchen Patient will benefit from skilled SLP services to address above impairments and improve overall function.  REHAB POTENTIAL: Excellent  PLAN:  SLP  FREQUENCY: 2x/week  SLP DURATION: 8 weeks  PLANNED INTERVENTIONS: Environmental controls, Cognitive reorganization, Internal/external aids, Functional tasks, SLP instruction and feedback, Compensatory strategies, Patient/family education, and [id number] Treatment of speech (30 or 45 min)    Referring diagnosis? P29.5J8A (ICD-10-CM) - Concussion with loss of consciousness, initial encounter Treatment diagnosis? (if different than referring diagnosis) R41.841 Cognitive communication deficit What was this (referring dx) caused by? []  Surgery [x]  Fall []  Ongoing issue []  Arthritis []  Other: ____________   Check all possible CPT codes:  *CHOOSE 10 OR LESS*    See Planned Interventions listed in the Plan section of the Evaluation.    Devin Foskey, CCC-SLP 09/03/2023, 10:34 AM

## 2023-09-07 ENCOUNTER — Ambulatory Visit

## 2023-09-07 DIAGNOSIS — S060X9A Concussion with loss of consciousness of unspecified duration, initial encounter: Secondary | ICD-10-CM | POA: Diagnosis not present

## 2023-09-07 DIAGNOSIS — R41841 Cognitive communication deficit: Secondary | ICD-10-CM

## 2023-09-07 NOTE — Therapy (Signed)
 OUTPATIENT SPEECH LANGUAGE PATHOLOGY TREATMENT   Patient Name: Lucas Shaffer. MRN: [id number] DOB:1955/09/20, 68 y.o., male Today's Date: 09/07/2023  PCP: Daria Pastures, PA REFERRING PROVIDER: Rodolph Bong, MD  END OF SESSION:  End of Session - 09/07/23 0816     Visit Number 2    Number of Visits 17    Date for SLP Re-Evaluation 11/05/23   extended one week due to scheduling   SLP Start Time 0815   checked in at 0813   SLP Stop Time  0845    SLP Time Calculation (min) 30 min    Activity Tolerance Patient tolerated treatment well              Past Medical History:  Diagnosis Date   Arthritis    GERD (gastroesophageal reflux disease)    H/O adenomatous polyp of colon    last colonoscopy reported to be in 2023 with surveillance advised 2026   Hyperlipidemia    Hypertension    Past Surgical History:  Procedure Laterality Date   CHOLECYSTECTOMY     ESOPHAGOGASTRODUODENOSCOPY (EGD) WITH PROPOFOL N/A 07/08/2023   Procedure: ESOPHAGOGASTRODUODENOSCOPY (EGD) WITH PROPOFOL;  Surgeon: Corbin Ade, MD;  Location: [facility];  Service: Endoscopy;  Laterality: N/A;  8:15 am, asa 2   MALONEY DILATION N/A 07/08/2023   Procedure: MALONEY DILATION;  Surgeon: Corbin Ade, MD;  Location: [facility];  Service: Endoscopy;  Laterality: N/A;   VASECTOMY     Patient Active Problem List   Diagnosis Date Noted   Hypogonadism in male 07/12/2023   Low testosterone 07/01/2023   Organic impotence 07/01/2023   Prostate cancer screening 07/01/2023   Dysphagia 06/04/2023   Ureterolithiasis 01/23/2022   Bilateral carpal tunnel syndrome 01/20/2022   Essential tremor 01/06/2017   Essential (primary) hypertension 01/06/2017   Gastro-esophageal reflux disease without esophagitis 01/06/2017   Tobacco abuse 01/06/2017    ONSET DATE: 07/30/23   REFERRING DIAG:  W09.0X9A (ICD-10-CM) - Concussion with loss of consciousness, initial encounter    THERAPY DIAG:  Cognitive  communication deficit  Rationale for Evaluation and Treatment: Rehabilitation  SUBJECTIVE:   SUBJECTIVE STATEMENT: "I don't know why I thought it was only 7 minutes here. It's more like 10. And the way I came today it was 13."  Pt accompanied by: self  PERTINENT HISTORY:  Pt going down the stairs, his dog pulled him, and falling down 5-10 stairs, hitting his head. He was transported by EMS to the [facility] ED. Pt was seen again at the ED on 08/03/23.  PAIN:  Are you having pain? Yes: NPRS scale: 2/10 Pain location: rt top-lateral head Pain description: dull  FALLS: Has patient fallen in last 6 months?  Yes, Number of falls: 1   PATIENT GOALS: Improve in deficit areas  OBJECTIVE:  Note: Objective measures were completed at Evaluation unless otherwise noted.  DIAGNOSTIC FINDINGS:  CT Head Wo Contrast Result Date: 07/30/2023 CLINICAL DATA:  Facial trauma, blunt EXAM: CT HEAD WITHOUT CONTRAST TECHNIQUE: Contiguous axial images were obtained from the base of the skull through the vertex without intravenous contrast. RADIATION DOSE REDUCTION: This exam was performed according to the departmental dose-optimization program which includes automated exposure control, adjustment of the mA and/or kV according to patient size and/or use of iterative reconstruction technique. COMPARISON:  None Available. FINDINGS: Brain: No evidence of acute infarction, hemorrhage, hydrocephalus, extra-axial collection or mass lesion/mass effect. Minimal low-density changes within the periventricular and subcortical white matter most compatible with  chronic microvascular ischemic change. Mild diffuse cerebral volume loss. Vascular: Atherosclerotic calcifications involving the large vessels of the skull base. No unexpected hyperdense vessel. Skull: Normal. Negative for fracture or focal lesion. Sinuses/Orbits: No acute finding. Other: Soft tissue laceration overlying the anterior right frontal region. Degenerative  changes of the left temporomandibular joint. IMPRESSION: 1. No acute intracranial findings. 2. Soft tissue laceration overlying the anterior right frontal region. No underlying calvarial fracture.   STANDARDIZED ASSESSMENTS: Cognitive Linguistic Quick Test: (CLQT): Completed today. The CLQT was administered to assess the relative status of five cognitive domains: attention, memory, language, executive functioning, and visuospatial skills. Scores from 10 tasks were used to estimate severity ratings (standardized for age groups 18-69 years and 70-89 years) for each domain, a clock drawing task, as well as an overall composite severity rating of cognition.      Task Score Criterion Cut Scores  Personal Facts 8/8 8  Symbol Cancellation 10/12 11  Confrontation Naming 10/10 10  Clock Drawing  11/13 12  Story Retelling 8/10 6  Symbol Trails 10/10 9  Generative Naming 6/9 5  Design Memory 3/6 5  Mazes  8/8 7  Design Generation 5/13 6   Cognitive Domain Composite Score Severity Rating  Attention 189/215 WNL  Memory 140/185 Moderate  Executive Function 29/40 WNL  Language 32/37 WNL  Visuospatial Skills 81/105 Mild  Clock Drawing  11/13 Mild  Composite Severity Rating 3.4 Mild   Pt demonstrated deficits in attention to detail today as well as a difficulty in processing information. This was seen in decr'd scores in symbol cancellation, clock drawing, naming, memory for designs, and design generation. SLP would postulate that memory score was decr'd more due to processing for design memory and in generative naming for /m/ words, specifically (due to rules associated with this task).    PATIENT REPORTED OUTCOME MEASURES (PROM): Cognitive Function: to be administered at first 1-2 sessions                                                                                                                            TREATMENT DATE:   09/07/23: Pt will complete PROM and receive memory strategies  education next session. Completed CLQT. Results above. SLP led pt through correction of his homework and pt found none of his 4 incorrect answers. Overall accuracy 94%. SLP suggested pt have someone Patent attorney) take a second look at something at work if it is detailed, and pt stated this is already occurring - even prior to the fall. SLP reiterated the importance of this.   09/03/23 (eval date): n/a  PATIENT EDUCATION: Education details: See "treatment date" this date Person educated: Patient Education method: Explanation Education comprehension: verbalized understanding   GOALS: Goals reviewed with patient? Yes, in general  SHORT TERM GOALS: Target date: 10/01/23  Sayan will complete CLQT in first therapy session Baseline: Goal status: INITIAL  2.   Pt will tell SLP 3 trained compensatory strategies he could  use for cognitive communication deficits Baseline:  Goal status: INITIAL  3.   Pt will demo use of one trained compensatory strategy, with min A to use, in 2 sessions Baseline:  Goal status: INITIAL  4.  Pt will complete functional detailed linguistic tasks with 100% success after self-correction and rare min questioning cues in 3 sessions Baseline:  Goal status: INITIAL  5. Pt will begin to use a memory compensation technique at work successfully. Baseline:  Goal status: INITIAL   LONG TERM GOALS: Target date: 11/05/23  Henrique will improve PROM score compared to initial administration Baseline:  Goal status: INITIAL  2.  Pt will demo use of two trained compensatory strategies in 2 sessions Baseline:  Goal status: INITIAL  3.  Pt will complete functional mod complex linguistic tasks with 100% success after self-correction in 3 sessions Baseline:  Goal status: INITIAL  4.  Pt will track medication administration correctly for 14 consecutive days Baseline:  Goal status: INITIAL  ASSESSMENT:  CLINICAL IMPRESSION: Patient is a 68 y.o. M who was seen  today for treatment of cognitive communication skills following a concussive event on 07/30/23. SLP completed the CLQT today and scores and interpretation above. STG #5 added. On eval date Severn gave examples of difficulty communicating ideas due to slower overall processing at work, decr'd memory at work, and decr'd memory at home (cannot recall if he's taken his meds).  OBJECTIVE IMPAIRMENTS: include attention, memory, and executive functioning. These impairments are limiting patient from managing medications, household responsibilities, ADLs/IADLs, and effectively communicating at home and in community. Factors affecting potential to achieve goals and functional outcome are  none noted today .Marland Kitchen Patient will benefit from skilled SLP services to address above impairments and improve overall function.  REHAB POTENTIAL: Excellent  PLAN:  SLP FREQUENCY: 2x/week  SLP DURATION: 8 weeks  PLANNED INTERVENTIONS: Environmental controls, Cognitive reorganization, Internal/external aids, Functional tasks, SLP instruction and feedback, Compensatory strategies, Patient/family education, and [id number] Treatment of speech (30 or 45 min)    Referring diagnosis? U04.5W0J (ICD-10-CM) - Concussion with loss of consciousness, initial encounter Treatment diagnosis? (if different than referring diagnosis) R41.841 Cognitive communication deficit What was this (referring dx) caused by? []  Surgery [x]  Fall []  Ongoing issue []  Arthritis []  Other: ____________   Check all possible CPT codes:  *CHOOSE 10 OR LESS*    See Planned Interventions listed in the Plan section of the Evaluation.    Hiilei Gerst, CCC-SLP 09/07/2023, 9:24 AM

## 2023-09-10 ENCOUNTER — Ambulatory Visit

## 2023-09-10 DIAGNOSIS — R41841 Cognitive communication deficit: Secondary | ICD-10-CM

## 2023-09-10 DIAGNOSIS — S060X9A Concussion with loss of consciousness of unspecified duration, initial encounter: Secondary | ICD-10-CM | POA: Diagnosis not present

## 2023-09-10 NOTE — Therapy (Signed)
 OUTPATIENT SPEECH LANGUAGE PATHOLOGY TREATMENT   Patient Name: Lucas Shaffer. MRN: [id number] DOB:July 21, 1955, 68 y.o., male Today's Date: 09/10/2023  PCP: Daria Pastures, PA REFERRING PROVIDER: Rodolph Bong, MD  END OF SESSION:  End of Session - 09/10/23 0942     Visit Number 3    Number of Visits 17    Date for SLP Re-Evaluation 11/05/23   extended one week due to scheduling   SLP Start Time 0935    SLP Stop Time  1015    SLP Time Calculation (min) 40 min    Activity Tolerance Patient tolerated treatment well              Past Medical History:  Diagnosis Date   Arthritis    GERD (gastroesophageal reflux disease)    H/O adenomatous polyp of colon    last colonoscopy reported to be in 2023 with surveillance advised 2026   Hyperlipidemia    Hypertension    Past Surgical History:  Procedure Laterality Date   CHOLECYSTECTOMY     ESOPHAGOGASTRODUODENOSCOPY (EGD) WITH PROPOFOL N/A 07/08/2023   Procedure: ESOPHAGOGASTRODUODENOSCOPY (EGD) WITH PROPOFOL;  Surgeon: Corbin Ade, MD;  Location: [facility];  Service: Endoscopy;  Laterality: N/A;  8:15 am, asa 2   MALONEY DILATION N/A 07/08/2023   Procedure: MALONEY DILATION;  Surgeon: Corbin Ade, MD;  Location: [facility];  Service: Endoscopy;  Laterality: N/A;   VASECTOMY     Patient Active Problem List   Diagnosis Date Noted   Hypogonadism in male 07/12/2023   Low testosterone 07/01/2023   Organic impotence 07/01/2023   Prostate cancer screening 07/01/2023   Dysphagia 06/04/2023   Ureterolithiasis 01/23/2022   Bilateral carpal tunnel syndrome 01/20/2022   Essential tremor 01/06/2017   Essential (primary) hypertension 01/06/2017   Gastro-esophageal reflux disease without esophagitis 01/06/2017   Tobacco abuse 01/06/2017    ONSET DATE: 07/30/23   REFERRING DIAG:  W09.0X9A (ICD-10-CM) - Concussion with loss of consciousness, initial encounter    THERAPY DIAG:  Cognitive communication  deficit  Rationale for Evaluation and Treatment: Rehabilitation  SUBJECTIVE:   SUBJECTIVE STATEMENT: Pt on time to therapy today. "That was dumb Baldo Ash." (Pt re: a mistake he made on homework)  Pt accompanied by: self  PERTINENT HISTORY:  Pt going down the stairs, his dog pulled him, and falling down 5-10 stairs, hitting his head. He was transported by EMS to the [facility] ED. Pt was seen again at the ED on 08/03/23.  PAIN:  Are you having pain? Yes: NPRS scale: 2/10 Pain location: rt top-lateral head Pain description: dull  FALLS: Has patient fallen in last 6 months?  Yes, Number of falls: 1   PATIENT GOALS: Improve in deficit areas  OBJECTIVE:  Note: Objective measures were completed at Evaluation unless otherwise noted.  DIAGNOSTIC FINDINGS:  CT Head Wo Contrast Result Date: 07/30/2023 CLINICAL DATA:  Facial trauma, blunt EXAM: CT HEAD WITHOUT CONTRAST TECHNIQUE: Contiguous axial images were obtained from the base of the skull through the vertex without intravenous contrast. RADIATION DOSE REDUCTION: This exam was performed according to the departmental dose-optimization program which includes automated exposure control, adjustment of the mA and/or kV according to patient size and/or use of iterative reconstruction technique. COMPARISON:  None Available. FINDINGS: Brain: No evidence of acute infarction, hemorrhage, hydrocephalus, extra-axial collection or mass lesion/mass effect. Minimal low-density changes within the periventricular and subcortical white matter most compatible with chronic microvascular ischemic change. Mild diffuse cerebral volume loss. Vascular: Atherosclerotic calcifications  involving the large vessels of the skull base. No unexpected hyperdense vessel. Skull: Normal. Negative for fracture or focal lesion. Sinuses/Orbits: No acute finding. Other: Soft tissue laceration overlying the anterior right frontal region. Degenerative changes of the left temporomandibular  joint. IMPRESSION: 1. No acute intracranial findings. 2. Soft tissue laceration overlying the anterior right frontal region. No underlying calvarial fracture.   STANDARDIZED ASSESSMENTS: Cognitive Linguistic Quick Test: (CLQT): Completed today. The CLQT was administered to assess the relative status of five cognitive domains: attention, memory, language, executive functioning, and visuospatial skills. Scores from 10 tasks were used to estimate severity ratings (standardized for age groups 18-69 years and 70-89 years) for each domain, a clock drawing task, as well as an overall composite severity rating of cognition.      Task Score Criterion Cut Scores  Personal Facts 8/8 8  Symbol Cancellation 10/12 11  Confrontation Naming 10/10 10  Clock Drawing  11/13 12  Story Retelling 8/10 6  Symbol Trails 10/10 9  Generative Naming 6/9 5  Design Memory 3/6 5  Mazes  8/8 7  Design Generation 5/13 6   Cognitive Domain Composite Score Severity Rating  Attention 189/215 WNL  Memory 140/185 Moderate  Executive Function 29/40 WNL  Language 32/37 WNL  Visuospatial Skills 81/105 Mild  Clock Drawing  11/13 Mild  Composite Severity Rating 3.4 Mild   Pt demonstrated deficits in attention to detail today as well as a difficulty in processing information. This was seen in decr'd scores in symbol cancellation, clock drawing, naming, memory for designs, and design generation. SLP would postulate that memory score was decr'd more due to processing for design memory and in generative naming for /m/ words, specifically (due to rules associated with this task).    PATIENT REPORTED OUTCOME MEASURES (PROM): Cognitive Function: provided to pt on 09/10/23 with instructions to return next session                                                                                                                            TREATMENT DATE:   09/10/23: Pt brought homework in, which he stated he double checked. He made two  errors (overall accuracy 97%). See "S" above. SLP went through a detailed task with pt and he req'd cues for processing speed. He req'd cue for flexibility of thought, and agreed that prior to concussion he would have completed this task sooner than it took him during this session. Homework provided.  09/07/23: Pt will complete PROM and receive memory strategies education next session. Completed CLQT. Results above. SLP led pt through correction of his homework and pt found none of his 4 incorrect answers. Overall accuracy 94%. SLP suggested pt have someone Patent attorney) take a second look at something at work if it is detailed, and pt stated this is already occurring - even prior to the fall. SLP reiterated the importance of this.   09/03/23 (eval date): n/a  PATIENT EDUCATION: Education details: See "  treatment date" this date Person educated: Patient Education method: Explanation Education comprehension: verbalized understanding   GOALS: Goals reviewed with patient? Yes, in general  SHORT TERM GOALS: Target date: 10/01/23  Shyquan will complete CLQT in first therapy session Baseline: Goal status: Met  2.   Pt will tell SLP 3 trained compensatory strategies he could use for cognitive communication deficits Baseline:  Goal status: INITIAL  3.   Pt will demo use of one trained compensatory strategy, with min A to use, in 2 sessions Baseline:  Goal status: INITIAL  4.  Pt will complete functional detailed linguistic tasks with 100% success after self-correction and rare min questioning cues in 3 sessions Baseline:  Goal status: INITIAL  5. Pt will begin to use a memory compensation technique at work successfully. Baseline:  Goal status: INITIAL   LONG TERM GOALS: Target date: 11/05/23  Joedy will improve PROM score compared to initial administration Baseline:  Goal status: INITIAL  2.  Pt will demo use of two trained compensatory strategies in 2 sessions Baseline:  Goal  status: INITIAL  3.  Pt will complete functional mod complex linguistic tasks with 100% success after self-correction in 3 sessions Baseline:  Goal status: INITIAL  4.  Pt will track medication administration correctly for 14 consecutive days Baseline:  Goal status: INITIAL  ASSESSMENT:  CLINICAL IMPRESSION: Patient is a 68 y.o. M who was seen today for treatment of cognitive communication skills following a concussive event on 07/30/23. SLP completed the CLQT today and scores and interpretation above. STG #5 added. On eval date Johnie gave examples of difficulty communicating ideas due to slower overall processing at work, decr'd memory at work, and decr'd memory at home (cannot recall if he's taken his meds).  OBJECTIVE IMPAIRMENTS: include attention, memory, and executive functioning. These impairments are limiting patient from managing medications, household responsibilities, ADLs/IADLs, and effectively communicating at home and in community. Factors affecting potential to achieve goals and functional outcome are  none noted today .Marland Kitchen Patient will benefit from skilled SLP services to address above impairments and improve overall function.  REHAB POTENTIAL: Excellent  PLAN:  SLP FREQUENCY: 2x/week  SLP DURATION: 8 weeks  PLANNED INTERVENTIONS: Environmental controls, Cognitive reorganization, Internal/external aids, Functional tasks, SLP instruction and feedback, Compensatory strategies, Patient/family education, and 16109 Treatment of speech (30 or 45 min)    Referring diagnosis? U04.5W0J (ICD-10-CM) - Concussion with loss of consciousness, initial encounter Treatment diagnosis? (if different than referring diagnosis) R41.841 Cognitive communication deficit What was this (referring dx) caused by? []  Surgery [x]  Fall []  Ongoing issue []  Arthritis []  Other: ____________   Check all possible CPT codes:  *CHOOSE 10 OR LESS*    See Planned Interventions listed in the Plan section  of the Evaluation.    Kasch Borquez, CCC-SLP 09/10/2023, 9:42 AM

## 2023-09-14 ENCOUNTER — Ambulatory Visit

## 2023-09-14 DIAGNOSIS — S060X9A Concussion with loss of consciousness of unspecified duration, initial encounter: Secondary | ICD-10-CM | POA: Diagnosis not present

## 2023-09-14 DIAGNOSIS — R41841 Cognitive communication deficit: Secondary | ICD-10-CM | POA: Diagnosis not present

## 2023-09-14 NOTE — Therapy (Signed)
 OUTPATIENT SPEECH LANGUAGE PATHOLOGY TREATMENT   Patient Name: Lucas Shaffer. MRN: [id number] DOB:1956-02-02, 68 y.o., male Today's Date: 09/14/2023  PCP: Daria Pastures, PA REFERRING PROVIDER: Rodolph Bong, MD  END OF SESSION:  End of Session - 09/14/23 0913     Visit Number 4    Number of Visits 17    Date for SLP Re-Evaluation 11/05/23    SLP Start Time 0804    SLP Stop Time  0845    SLP Time Calculation (min) 41 min    Activity Tolerance Patient tolerated treatment well               Past Medical History:  Diagnosis Date   Arthritis    GERD (gastroesophageal reflux disease)    H/O adenomatous polyp of colon    last colonoscopy reported to be in 2023 with surveillance advised 2026   Hyperlipidemia    Hypertension    Past Surgical History:  Procedure Laterality Date   CHOLECYSTECTOMY     ESOPHAGOGASTRODUODENOSCOPY (EGD) WITH PROPOFOL N/A 07/08/2023   Procedure: ESOPHAGOGASTRODUODENOSCOPY (EGD) WITH PROPOFOL;  Surgeon: Corbin Ade, MD;  [facility]: [facility];  Service: Endoscopy;  Laterality: N/A;  8:15 am, asa 2   MALONEY DILATION N/A 07/08/2023   Procedure: MALONEY DILATION;  Surgeon: Corbin Ade, MD;  Location: [facility];  Service: Endoscopy;  Laterality: N/A;   VASECTOMY     Patient Active Problem List   Diagnosis Date Noted   Hypogonadism in male 07/12/2023   Low testosterone 07/01/2023   Organic impotence 07/01/2023   Prostate cancer screening 07/01/2023   Dysphagia 06/04/2023   Ureterolithiasis 01/23/2022   Bilateral carpal tunnel syndrome 01/20/2022   Essential tremor 01/06/2017   Essential (primary) hypertension 01/06/2017   Gastro-esophageal reflux disease without esophagitis 01/06/2017   Tobacco abuse 01/06/2017    ONSET DATE: 07/30/23   REFERRING DIAG:  N82.0X9A (ICD-10-CM) - Concussion with loss of consciousness, initial encounter    THERAPY DIAG:  Cognitive communication deficit  Rationale for Evaluation and Treatment:  Rehabilitation  SUBJECTIVE:   SUBJECTIVE STATEMENT: "I got anxious about the fourth step (this morning) and had to just go down real quick."  Pt accompanied by: self  PERTINENT HISTORY:  Pt going down the stairs, his dog pulled him, and falling down 5-10 stairs, hitting his head. He was transported by EMS to the [facility] ED. Pt was seen again at the ED on 08/03/23.  PAIN:  Are you having pain? No  FALLS: Has patient fallen in last 6 months?  Yes, Number of falls: 1   PATIENT GOALS: Improve in deficit areas  OBJECTIVE:  Note: Objective measures were completed at Evaluation unless otherwise noted.  DIAGNOSTIC FINDINGS:  CT Head Wo Contrast Result Date: 07/30/2023 CLINICAL DATA:  Facial trauma, blunt EXAM: CT HEAD WITHOUT CONTRAST TECHNIQUE: Contiguous axial images were obtained from the base of the skull through the vertex without intravenous contrast. RADIATION DOSE REDUCTION: This exam was performed according to the departmental dose-optimization program which includes automated exposure control, adjustment of the mA and/or kV according to patient size and/or use of iterative reconstruction technique. COMPARISON:  None Available. FINDINGS: Brain: No evidence of acute infarction, hemorrhage, hydrocephalus, extra-axial collection or mass lesion/mass effect. Minimal low-density changes within the periventricular and subcortical white matter most compatible with chronic microvascular ischemic change. Mild diffuse cerebral volume loss. Vascular: Atherosclerotic calcifications involving the large vessels of the skull base. No unexpected hyperdense vessel. Skull: Normal. Negative for fracture or  focal lesion. Sinuses/Orbits: No acute finding. Other: Soft tissue laceration overlying the anterior right frontal region. Degenerative changes of the left temporomandibular joint. IMPRESSION: 1. No acute intracranial findings. 2. Soft tissue laceration overlying the anterior right frontal region. No  underlying calvarial fracture.   STANDARDIZED ASSESSMENTS: Cognitive Linguistic Quick Test: (CLQT): Completed today. The CLQT was administered to assess the relative status of five cognitive domains: attention, memory, language, executive functioning, and visuospatial skills. Scores from 10 tasks were used to estimate severity ratings (standardized for age groups 18-69 years and 70-89 years) for each domain, a clock drawing task, as well as an overall composite severity rating of cognition.      Task Score Criterion Cut Scores  Personal Facts 8/8 8  Symbol Cancellation 10/12 11  Confrontation Naming 10/10 10  Clock Drawing  11/13 12  Story Retelling 8/10 6  Symbol Trails 10/10 9  Generative Naming 6/9 5  Design Memory 3/6 5  Mazes  8/8 7  Design Generation 5/13 6   Cognitive Domain Composite Score Severity Rating  Attention 189/215 WNL  Memory 140/185 Moderate  Executive Function 29/40 WNL  Language 32/37 WNL  Visuospatial Skills 81/105 Mild  Clock Drawing  11/13 Mild  Composite Severity Rating 3.4 Mild   Pt demonstrated deficits in attention to detail today as well as a difficulty in processing information. This was seen in decr'd scores in symbol cancellation, clock drawing, naming, memory for designs, and design generation. SLP would postulate that memory score was decr'd more due to processing for design memory and in generative naming for /m/ words, specifically (due to rules associated with this task).    PATIENT REPORTED OUTCOME MEASURES (PROM): Cognitive Function: Pt scored himself 103/140, with higher scores indicating lesser impact of cognitive ability on everyday life                                                                                                                            TREATMENT DATE:   09/14/23: Pt brought his homework back - pt self corrected, for 100% accuracy. Pt stated he forgot a doctor's appointment yesterday so SLP provided him with memory  strategies (see "pt instructions"). "One thing I could do, and I've been thinking about this, is to use a calendar for appointments." Pt is using pillbox, which pt fills himself. He has had one or two skips in the last 4 weeks so SLP suggested an alarm in his phone. Pt stated it would be a good idea, but he just took his meds when he arrived home. Sherill seemed less-concerned about this today than he did at time of evaluation. For the last portion of therapy SLP assisted pt putting his appointments into his phone. When asked, pt stated he didn't think he needed to self correct, but SLP saw him make mistake and encouraged him to look over his work. He found his error when doing so. Homework provided for detailed work.  09/10/23: Pt brought  homework in, which he stated he double checked. He made two errors (overall accuracy 97%). See "S" above. SLP went through a detailed task with pt and he req'd cues for processing speed. He req'd cue for flexibility of thought, and agreed that prior to concussion he would have completed this task sooner than it took him during this session. Homework provided.  09/07/23: Pt will complete PROM and receive memory strategies education next session. Completed CLQT. Results above. SLP led pt through correction of his homework and pt found none of his 4 incorrect answers. Overall accuracy 94%. SLP suggested pt have someone Patent attorney) take a second look at something at work if it is detailed, and pt stated this is already occurring - even prior to the fall. SLP reiterated the importance of this.   09/03/23 (eval date): n/a  PATIENT EDUCATION: Education details: See "treatment date" this date Person educated: Patient Education method: Explanation Education comprehension: verbalized understanding   GOALS: Goals reviewed with patient? Yes, in general  SHORT TERM GOALS: Target date: 10/01/23  Atharva will complete CLQT in first therapy session Baseline: Goal status:  Met  2.   Pt will tell SLP 3 trained compensatory strategies he could use for cognitive communication deficits Baseline:  Goal status: INITIAL  3.   Pt will demo use of one trained compensatory strategy, with min A to use, in 2 sessions Baseline:  Goal status: INITIAL  4.  Pt will complete functional detailed linguistic tasks with 100% success after self-correction and rare min questioning cues in 3 sessions Baseline:  Goal status: INITIAL  5. Pt will begin to use a memory compensation technique at work successfully. Baseline:  Goal status: INITIAL   LONG TERM GOALS: Target date: 11/05/23  Norma will improve PROM score compared to initial administration Baseline:  Goal status: INITIAL  2.  Pt will demo use of two trained compensatory strategies in 2 sessions Baseline:  Goal status: INITIAL  3.  Pt will complete functional mod complex linguistic tasks with 100% success after self-correction in 3 sessions Baseline:  Goal status: INITIAL  4.  Pt will track medication administration correctly for 14 consecutive days Baseline:  Goal status: INITIAL  ASSESSMENT:  CLINICAL IMPRESSION: Patient is a 68 y.o. M who was seen today for treatment of cognitive communication skills following a concussive event on 07/30/23. On eval date Toy gave examples of difficulty communicating ideas due to slower overall processing at work, decr'd memory at work, and decr'd memory at home (cannot recall if he's taken his meds).  OBJECTIVE IMPAIRMENTS: include attention, memory, and executive functioning. These impairments are limiting patient from managing medications, household responsibilities, ADLs/IADLs, and effectively communicating at home and in community. Factors affecting potential to achieve goals and functional outcome are  none noted today .Marland Kitchen Patient will benefit from skilled SLP services to address above impairments and improve overall function.  REHAB POTENTIAL:  Excellent  PLAN:  SLP FREQUENCY: 2x/week  SLP DURATION: 8 weeks  PLANNED INTERVENTIONS: Environmental controls, Cognitive reorganization, Internal/external aids, Functional tasks, SLP instruction and feedback, Compensatory strategies, Patient/family education, and [id number] Treatment of speech (30 or 45 min)    Referring diagnosis? A21.3Y8M (ICD-10-CM) - Concussion with loss of consciousness, initial encounter Treatment diagnosis? (if different than referring diagnosis) R41.841 Cognitive communication deficit What was this (referring dx) caused by? []  Surgery [x]  Fall []  Ongoing issue []  Arthritis []  Other: ____________   Check all possible CPT codes:  *CHOOSE 10 OR LESS*    See Planned  Interventions listed in the Plan section of the Evaluation.    Deeric Cruise, CCC-SLP 09/14/2023, 9:14 AM

## 2023-09-14 NOTE — Patient Instructions (Signed)

## 2023-09-16 DIAGNOSIS — J309 Allergic rhinitis, unspecified: Secondary | ICD-10-CM | POA: Diagnosis not present

## 2023-09-16 DIAGNOSIS — Z6831 Body mass index (BMI) 31.0-31.9, adult: Secondary | ICD-10-CM | POA: Diagnosis not present

## 2023-09-17 ENCOUNTER — Ambulatory Visit

## 2023-09-20 ENCOUNTER — Ambulatory Visit

## 2023-09-20 DIAGNOSIS — R41841 Cognitive communication deficit: Secondary | ICD-10-CM | POA: Diagnosis not present

## 2023-09-20 DIAGNOSIS — S060X9A Concussion with loss of consciousness of unspecified duration, initial encounter: Secondary | ICD-10-CM | POA: Diagnosis not present

## 2023-09-20 NOTE — Therapy (Signed)
 OUTPATIENT SPEECH LANGUAGE PATHOLOGY TREATMENT   Patient Name: Lucas Shaffer. MRN: [id number] DOB:09-03-1955, 68 y.o., male Today's Date: 09/20/2023  PCP: Daria Pastures, PA REFERRING PROVIDER: Rodolph Bong, MD  END OF SESSION:      Past Medical History:  Diagnosis Date   Arthritis    GERD (gastroesophageal reflux disease)    H/O adenomatous polyp of colon    last colonoscopy reported to be in 2023 with surveillance advised 2026   Hyperlipidemia    Hypertension    Past Surgical History:  Procedure Laterality Date   CHOLECYSTECTOMY     ESOPHAGOGASTRODUODENOSCOPY (EGD) WITH PROPOFOL N/A 07/08/2023   Procedure: ESOPHAGOGASTRODUODENOSCOPY (EGD) WITH PROPOFOL;  Surgeon: Corbin Ade, MD;  Location: [facility];  Service: Endoscopy;  Laterality: N/A;  8:15 am, asa 2   MALONEY DILATION N/A 07/08/2023   Procedure: MALONEY DILATION;  Surgeon: Corbin Ade, MD;  Location: [facility];  Service: Endoscopy;  Laterality: N/A;   VASECTOMY     Patient Active Problem List   Diagnosis Date Noted   Hypogonadism in male 07/12/2023   Low testosterone 07/01/2023   Organic impotence 07/01/2023   Prostate cancer screening 07/01/2023   Dysphagia 06/04/2023   Ureterolithiasis 01/23/2022   Bilateral carpal tunnel syndrome 01/20/2022   Essential tremor 01/06/2017   Essential (primary) hypertension 01/06/2017   Gastro-esophageal reflux disease without esophagitis 01/06/2017   Tobacco abuse 01/06/2017    ONSET DATE: 07/30/23   REFERRING DIAG:  W09.0X9A (ICD-10-CM) - Concussion with loss of consciousness, initial encounter    THERAPY DIAG:  Cognitive communication deficit  Rationale for Evaluation and Treatment: Rehabilitation  SUBJECTIVE:   SUBJECTIVE STATEMENT: "I don't really think my memory is giving me trouble now, Nissi Doffing."  Pt accompanied by: self  PERTINENT HISTORY:  Pt going down the stairs, his dog pulled him, and falling down 5-10 stairs, hitting his head. He was  transported by EMS to the [facility] ED. Pt was seen again at the ED on 08/03/23.  PAIN:  Are you having pain? No  FALLS: Has patient fallen in last 6 months?  Yes, Number of falls: 1   PATIENT GOALS: Improve in deficit areas  OBJECTIVE:  Note: Objective measures were completed at Evaluation unless otherwise noted.  DIAGNOSTIC FINDINGS:  CT Head Wo Contrast Result Date: 07/30/2023 CLINICAL DATA:  Facial trauma, blunt EXAM: CT HEAD WITHOUT CONTRAST TECHNIQUE: Contiguous axial images were obtained from the base of the skull through the vertex without intravenous contrast. RADIATION DOSE REDUCTION: This exam was performed according to the departmental dose-optimization program which includes automated exposure control, adjustment of the mA and/or kV according to patient size and/or use of iterative reconstruction technique. COMPARISON:  None Available. FINDINGS: Brain: No evidence of acute infarction, hemorrhage, hydrocephalus, extra-axial collection or mass lesion/mass effect. Minimal low-density changes within the periventricular and subcortical white matter most compatible with chronic microvascular ischemic change. Mild diffuse cerebral volume loss. Vascular: Atherosclerotic calcifications involving the large vessels of the skull base. No unexpected hyperdense vessel. Skull: Normal. Negative for fracture or focal lesion. Sinuses/Orbits: No acute finding. Other: Soft tissue laceration overlying the anterior right frontal region. Degenerative changes of the left temporomandibular joint. IMPRESSION: 1. No acute intracranial findings. 2. Soft tissue laceration overlying the anterior right frontal region. No underlying calvarial fracture.   STANDARDIZED ASSESSMENTS: Cognitive Linguistic Quick Test: (CLQT): Completed today. The CLQT was administered to assess the relative status of five cognitive domains: attention, memory, language, executive functioning, and visuospatial skills. Scores  from 10 tasks  were used to estimate severity ratings (standardized for age groups 18-69 years and 70-89 years) for each domain, a clock drawing task, as well as an overall composite severity rating of cognition.      Task Score Criterion Cut Scores  Personal Facts 8/8 8  Symbol Cancellation 10/12 11  Confrontation Naming 10/10 10  Clock Drawing  11/13 12  Story Retelling 8/10 6  Symbol Trails 10/10 9  Generative Naming 6/9 5  Design Memory 3/6 5  Mazes  8/8 7  Design Generation 5/13 6   Cognitive Domain Composite Score Severity Rating  Attention 189/215 WNL  Memory 140/185 Moderate  Executive Function 29/40 WNL  Language 32/37 WNL  Visuospatial Skills 81/105 Mild  Clock Drawing  11/13 Mild  Composite Severity Rating 3.4 Mild   Pt demonstrated deficits in attention to detail today as well as a difficulty in processing information. This was seen in decr'd scores in symbol cancellation, clock drawing, naming, memory for designs, and design generation. SLP would postulate that memory score was decr'd more due to processing for design memory and in generative naming for /m/ words, specifically (due to rules associated with this task).    PATIENT REPORTED OUTCOME MEASURES (PROM): Cognitive Function: Pt scored himself 103/140, with higher scores indicating lesser impact of cognitive ability on everyday life                                                                                                                            TREATMENT DATE:   09/20/23: Pt has positioned his meds in a more central location. Pt will ask Candy and Casimiro Needle about his memory- if it's different than 6 months ago. He does not think it's different. SLP asked pt about focus and pt told him sx of decr'd alternating attention on the job or at home (talking with friend and talking to and petting the dog, then losing train of thought. SLP guided pt through simple written task and answering yes/no and simple wh questions. He did so in  this setting (simple written task and simple auditory information) with overall 93% accuracy and good accuracy with verbal answering given auditory information SLP presented him with. Pt to cont with detailed instructions/directions and tasks with homework provided today.   09/14/23: Pt brought his homework back - pt self corrected, for 100% accuracy. Pt stated he forgot a doctor's appointment yesterday so SLP provided him with memory strategies (see "pt instructions"). "One thing I could do, and I've been thinking about this, is to use a calendar for appointments." Pt is using pillbox, which pt fills himself. He has had one or two skips in the last 4 weeks so SLP suggested an alarm in his phone. Pt stated it would be a good idea, but he just took his meds when he arrived home. Aviv seemed less-concerned about this today than he did at time of evaluation. For the last portion of therapy SLP  assisted pt putting his appointments into his phone. When asked, pt stated he didn't think he needed to self correct, but SLP saw him make mistake and encouraged him to look over his work. He found his error when doing so. Homework provided for detailed work.  09/10/23: Pt brought homework in, which he stated he double checked. He made two errors (overall accuracy 97%). See "S" above. SLP went through a detailed task with pt and he req'd cues for processing speed. He req'd cue for flexibility of thought, and agreed that prior to concussion he would have completed this task sooner than it took him during this session. Homework provided.  09/07/23: Pt will complete PROM and receive memory strategies education next session. Completed CLQT. Results above. SLP led pt through correction of his homework and pt found none of his 4 incorrect answers. Overall accuracy 94%. SLP suggested pt have someone Patent attorney) take a second look at something at work if it is detailed, and pt stated this is already occurring - even prior  to the fall. SLP reiterated the importance of this.   09/03/23 (eval date): n/a  PATIENT EDUCATION: Education details: See "treatment date" this date Person educated: Patient Education method: Explanation Education comprehension: verbalized understanding   GOALS: Goals reviewed with patient? Yes, in general  SHORT TERM GOALS: Target date: 10/01/23  Reace will complete CLQT in first therapy session Baseline: Goal status: Met  2.   Pt will tell SLP 3 trained compensatory strategies he could use for cognitive communication deficits Baseline:  Goal status: INITIAL  3.   Pt will demo use of one trained compensatory strategy, with min A to use, in 2 sessions Baseline:  Goal status: INITIAL  4.  Pt will complete functional detailed linguistic tasks with 100% success after self-correction and rare min questioning cues in 3 sessions Baseline:  Goal status: INITIAL  5. Pt will begin to use a memory compensation technique at work successfully. Baseline:  Goal status: INITIAL   LONG TERM GOALS: Target date: 11/05/23  Jarious will improve PROM score compared to initial administration Baseline:  Goal status: INITIAL  2.  Pt will demo use of two trained compensatory strategies in 2 sessions Baseline:  Goal status: INITIAL  3.  Pt will complete functional mod complex linguistic tasks with 100% success after self-correction in 3 sessions Baseline:  Goal status: INITIAL  4.  Pt will track medication administration correctly for 14 consecutive days Baseline:  Goal status: INITIAL  ASSESSMENT:  CLINICAL IMPRESSION: Patient is a 68 y.o. M who was seen today for treatment of cognitive communication skills following a concussive event on 07/30/23. On eval date Herby gave examples of difficulty communicating ideas due to slower overall processing at work, decr'd memory at work, and decr'd memory at home (cannot recall if he's taken his meds).  OBJECTIVE IMPAIRMENTS: include attention,  memory, and executive functioning. These impairments are limiting patient from managing medications, household responsibilities, ADLs/IADLs, and effectively communicating at home and in community. Factors affecting potential to achieve goals and functional outcome are  none noted today .Marland Kitchen Patient will benefit from skilled SLP services to address above impairments and improve overall function.  REHAB POTENTIAL: Excellent  PLAN:  SLP FREQUENCY: 2x/week  SLP DURATION: 8 weeks  PLANNED INTERVENTIONS: Environmental controls, Cognitive reorganization, Internal/external aids, Functional tasks, SLP instruction and feedback, Compensatory strategies, Patient/family education, and [address] Treatment of speech (30 or 45 min)    Referring diagnosis? U04.5W0J (ICD-10-CM) - Concussion with loss of  consciousness, initial encounter Treatment diagnosis? (if different than referring diagnosis) R41.841 Cognitive communication deficit What was this (referring dx) caused by? []  Surgery [x]  Fall []  Ongoing issue []  Arthritis []  Other: ____________   Check all possible CPT codes:  *CHOOSE 10 OR LESS*    See Planned Interventions listed in the Plan section of the Evaluation.    Ashwini Jago, CCC-SLP 09/20/2023, 8:09 AM

## 2023-09-24 ENCOUNTER — Ambulatory Visit: Payer: Medicare HMO | Admitting: Family Medicine

## 2023-09-24 ENCOUNTER — Ambulatory Visit

## 2023-09-24 VITALS — BP 118/74 | HR 55 | Ht 72.0 in | Wt 231.0 lb

## 2023-09-24 DIAGNOSIS — R41841 Cognitive communication deficit: Secondary | ICD-10-CM | POA: Diagnosis not present

## 2023-09-24 DIAGNOSIS — S060X9A Concussion with loss of consciousness of unspecified duration, initial encounter: Secondary | ICD-10-CM | POA: Diagnosis not present

## 2023-09-24 DIAGNOSIS — S060X9D Concussion with loss of consciousness of unspecified duration, subsequent encounter: Secondary | ICD-10-CM | POA: Diagnosis not present

## 2023-09-24 NOTE — Therapy (Addendum)
 OUTPATIENT SPEECH LANGUAGE PATHOLOGY TREATMENT   Patient Name: Lucas Shaffer. MRN: [id number] DOB:1955-08-16, 68 y.o., male Today's Date: 09/24/2023  PCP: Lucas Pastures, PA REFERRING PROVIDER: Rodolph Bong, MD  END OF SESSION:  End of Session - 09/24/23 0854     Visit Number 6    Number of Visits 17    Date for SLP Re-Evaluation 11/05/23    SLP Start Time 0804    SLP Stop Time  0847    SLP Time Calculation (min) 43 min    Activity Tolerance Patient tolerated treatment well                Past Medical History:  Diagnosis Date   Arthritis    GERD (gastroesophageal reflux disease)    H/O adenomatous polyp of colon    last colonoscopy reported to be in 2023 with surveillance advised 2026   Hyperlipidemia    Hypertension    Past Surgical History:  Procedure Laterality Date   CHOLECYSTECTOMY     ESOPHAGOGASTRODUODENOSCOPY (EGD) WITH PROPOFOL N/A 07/08/2023   Procedure: ESOPHAGOGASTRODUODENOSCOPY (EGD) WITH PROPOFOL;  Surgeon: Lucas Ade, MD;  Location: [facility];  Service: Endoscopy;  Laterality: N/A;  8:15 am, asa 2   MALONEY DILATION N/A 07/08/2023   Procedure: MALONEY DILATION;  Surgeon: Lucas Ade, MD;  Location: [facility];  Service: Endoscopy;  Laterality: N/A;   VASECTOMY     Patient Active Problem List   Diagnosis Date Noted   Hypogonadism in male 07/12/2023   Low testosterone 07/01/2023   Organic impotence 07/01/2023   Prostate cancer screening 07/01/2023   Dysphagia 06/04/2023   Ureterolithiasis 01/23/2022   Bilateral carpal tunnel syndrome 01/20/2022   Essential tremor 01/06/2017   Essential (primary) hypertension 01/06/2017   Gastro-esophageal reflux disease without esophagitis 01/06/2017   Tobacco abuse 01/06/2017    ONSET DATE: 07/30/23   REFERRING DIAG:  W09.0X9A (ICD-10-CM) - Concussion with loss of consciousness, initial encounter    THERAPY DIAG:  Cognitive communication deficit  Rationale for Evaluation and  Treatment: Rehabilitation  SUBJECTIVE:   SUBJECTIVE STATEMENT: "I don't really think my memory is giving me trouble now, Lucas Shaffer."  Pt accompanied by: self  PERTINENT HISTORY:  Pt going down the stairs, his dog pulled him, and falling down 5-10 stairs, hitting his head. He was transported by EMS to the [facility] ED. Pt was seen again at the ED on 08/03/23.  PAIN:  Are you having pain? No  FALLS: Has patient fallen in last 6 months?  Yes, Number of falls: 1   PATIENT GOALS: Improve in deficit areas  OBJECTIVE:  Note: Objective measures were completed at Evaluation unless otherwise noted.  DIAGNOSTIC FINDINGS:  CT Head Wo Contrast Result Date: 07/30/2023 CLINICAL DATA:  Facial trauma, blunt EXAM: CT HEAD WITHOUT CONTRAST TECHNIQUE: Contiguous axial images were obtained from the base of the skull through the vertex without intravenous contrast. RADIATION DOSE REDUCTION: This exam was performed according to the departmental dose-optimization program which includes automated exposure control, adjustment of the mA and/or kV according to patient size and/or use of iterative reconstruction technique. COMPARISON:  None Available. FINDINGS: Brain: No evidence of acute infarction, hemorrhage, hydrocephalus, extra-axial collection or mass lesion/mass effect. Minimal low-density changes within the periventricular and subcortical white matter most compatible with chronic microvascular ischemic change. Mild diffuse cerebral volume loss. Vascular: Atherosclerotic calcifications involving the large vessels of the skull base. No unexpected hyperdense vessel. Skull: Normal. Negative for fracture or focal lesion. Sinuses/Orbits: No  acute finding. Other: Soft tissue laceration overlying the anterior right frontal region. Degenerative changes of the left temporomandibular joint. IMPRESSION: 1. No acute intracranial findings. 2. Soft tissue laceration overlying the anterior right frontal region. No underlying  calvarial fracture.   STANDARDIZED ASSESSMENTS: Cognitive Linguistic Quick Test: (CLQT): Completed today. The CLQT was administered to assess the relative status of five cognitive domains: attention, memory, language, executive functioning, and visuospatial skills. Scores from 10 tasks were used to estimate severity ratings (standardized for age groups 18-69 years and 70-89 years) for each domain, a clock drawing task, as well as an overall composite severity rating of cognition.      Task Score Criterion Cut Scores  Personal Facts 8/8 8  Symbol Cancellation 10/12 11  Confrontation Naming 10/10 10  Clock Drawing  11/13 12  Story Retelling 8/10 6  Symbol Trails 10/10 9  Generative Naming 6/9 5  Design Memory 3/6 5  Mazes  8/8 7  Design Generation 5/13 6   Cognitive Domain Composite Score Severity Rating  Attention 189/215 WNL  Memory 140/185 Moderate  Executive Function 29/40 WNL  Language 32/37 WNL  Visuospatial Skills 81/105 Mild  Clock Drawing  11/13 Mild  Composite Severity Rating 3.4 Mild   Pt demonstrated deficits in attention to detail today as well as a difficulty in processing information. This was seen in decr'd scores in symbol cancellation, clock drawing, naming, memory for designs, and design generation. SLP would postulate that memory score was decr'd more due to processing for design memory and in generative naming for /m/ words, specifically (due to rules associated with this task).    PATIENT REPORTED OUTCOME MEASURES (PROM): Cognitive Function: Pt scored himself 103/140, with higher scores indicating lesser impact of cognitive ability on everyday life                                                                                                                            TREATMENT DATE:   09/24/23: SLP guided pt through detailed task, with extra time pt completed this with 100% success. He states that his memory appears better, and remembered to ask Lucas Shaffer (and  report info back to SLP) who stated memory is a little better or the same. The work Animator who lives with Lucas Shaffer states he has noted it is better. Homework provided.  09/20/23: Pt has positioned his meds in a more central location. Pt will ask Lucas Shaffer and Casimiro Needle about his memory- if it's different than 6 months ago. He does not think it's different. SLP asked pt about focus and pt told him sx of decr'd alternating attention on the job or at home (talking with friend and talking to and petting the dog, then losing train of thought. SLP guided pt through simple written task and answering yes/no and simple wh questions. He did so in this setting (simple written task and simple auditory information) with overall 93% accuracy and good accuracy with verbal answering given auditory information  SLP presented him with. Pt to cont with detailed instructions/directions and tasks with homework provided today.   09/14/23: Pt brought his homework back - pt self corrected, for 100% accuracy. Pt stated he forgot a doctor's appointment yesterday so SLP provided him with memory strategies (see "pt instructions"). "One thing I could do, and I've been thinking about this, is to use a calendar for appointments." Pt is using pillbox, which pt fills himself. He has had one or two skips in the last 4 weeks so SLP suggested an alarm in his phone. Pt stated it would be a good idea, but he just took his meds when he arrived home. Jacque seemed less-concerned about this today than he did at time of evaluation. For the last portion of therapy SLP assisted pt putting his appointments into his phone. When asked, pt stated he didn't think he needed to self correct, but SLP saw him make mistake and encouraged him to look over his work. He found his error when doing so. Homework provided for detailed work.  09/10/23: Pt brought homework in, which he stated he double checked. He made two errors (overall accuracy 97%). See "S" above. SLP went  through a detailed task with pt and he req'd cues for processing speed. He req'd cue for flexibility of thought, and agreed that prior to concussion he would have completed this task sooner than it took him during this session. Homework provided.  09/07/23: Pt will complete PROM and receive memory strategies education next session. Completed CLQT. Results above. SLP led pt through correction of his homework and pt found none of his 4 incorrect answers. Overall accuracy 94%. SLP suggested pt have someone Patent attorney) take a second look at something at work if it is detailed, and pt stated this is already occurring - even prior to the fall. SLP reiterated the importance of this.   09/03/23 (eval date): n/a  PATIENT EDUCATION: Education details: See "treatment date" this date Person educated: Patient Education method: Explanation Education comprehension: verbalized understanding   GOALS: Goals reviewed with patient? Yes, in general  SHORT TERM GOALS: Target date: 10/01/23  Lamarkus will complete CLQT in first therapy session Baseline: Goal status: Met  2.   Pt will tell SLP 3 trained compensatory strategies he could use for cognitive communication deficits Baseline:  Goal status: INITIAL  3.   Pt will demo use of one trained compensatory strategy, with min A to use, in 2 sessions Baseline:  Goal status: INITIAL  4.  Pt will complete functional detailed linguistic tasks with 100% success after self-correction and rare min questioning cues in 3 sessions Baseline: 09/24/23 Goal status: INITIAL  5. Pt will begin to use a memory compensation technique at work successfully. Baseline: 09/24/23 Goal status: INITIAL   LONG TERM GOALS: Target date: 11/05/23  Arlander will improve PROM score compared to initial administration Baseline:  Goal status: INITIAL  2.  Pt will demo use of two trained compensatory strategies in 2 sessions Baseline:  Goal status: INITIAL  3.  Pt will complete  functional mod complex linguistic tasks with 100% success after self-correction in 3 sessions Baseline:  Goal status: INITIAL  4.  Pt will track medication administration correctly for 14 consecutive days Baseline:  Goal status: INITIAL  ASSESSMENT:  CLINICAL IMPRESSION: Patient is a 68 y.o. M who was seen today for treatment of cognitive communication skills following a concussive event on 07/30/23. See "treatment date' for details on today's session. On eval date Brittin gave  examples of difficulty communicating ideas due to slower overall processing at work, decr'd memory at work, and decr'd memory at home (cannot recall if he's taken his meds).  OBJECTIVE IMPAIRMENTS: include attention, memory, and executive functioning. These impairments are limiting patient from managing medications, household responsibilities, ADLs/IADLs, and effectively communicating at home and in community. Factors affecting potential to achieve goals and functional outcome are  none noted today .Marland Kitchen Patient will benefit from skilled SLP services to address above impairments and improve overall function.  REHAB POTENTIAL: Excellent  PLAN:  SLP FREQUENCY: 2x/week  SLP DURATION: 8 weeks  PLANNED INTERVENTIONS: Environmental controls, Cognitive reorganization, Internal/external aids, Functional tasks, SLP instruction and feedback, Compensatory strategies, Patient/family education, and [address] Treatment of speech (30 or 45 min)    Referring diagnosis? U04.5W0J (ICD-10-CM) - Concussion with loss of consciousness, initial encounter Treatment diagnosis? (if different than referring diagnosis) R41.841 Cognitive communication deficit What was this (referring dx) caused by? []  Surgery [x]  Fall []  Ongoing issue []  Arthritis []  Other: ____________   Check all possible CPT codes:  *CHOOSE 10 OR LESS*    See Planned Interventions listed in the Plan section of the Evaluation.    Conroy Goracke, CCC-SLP 09/24/2023, 8:55  AM

## 2023-09-24 NOTE — Progress Notes (Signed)
 Subjective:   I, Lucas Riser, PhD, LAT, ATC acting as a scribe for Lucas Graham, MD.  Chief Complaint: Lucas Shaffer.,  is a 68 y.o. male who presents for 54-month f/u concussion w/ LOC. Pt going down the stairs, his dog pulled him, falling down 5-10 stairs, hitting his head. Pt was last seen by Dr. Denyse Shaffer on 08/27/23 and was referred to speech therapy, completing 6 visits.  Today, pt reports he had a speech therapy visit earlier this morning. I thinks his memory is improving. He c/o cont'd mental processing. He expressing less fear.  He has about another month of cognitive rehab scheduled with speech therapy.  He feels pretty good how things are going.  Injury date: 07/30/23 Visit #: 2  History of Present Illness:   Concussion Self-Reported Symptom Score Symptoms rated on a scale 1-6, in last 24 hours  Headache: 0   Pressure in head: 1 Neck pain: 0 Nausea or vomiting: 0 Dizziness: 0  Blurred vision: 0  Balance problems: 0 Sensitivity to light:  0 Sensitivity to noise: 0 Feeling slowed down: 2 Feeling like "in a fog": 0 "Don't feel right": 0 Difficulty concentrating: 1 Difficulty remembering: 1 Fatigue or low energy: 2 Confusion: 0 Drowsiness: 1 More emotional: 0 Irritability: 1 Sadness: 0 Nervous or anxious: 0 Trouble falling asleep: 0   Total # of Symptoms: 7/22 Total Symptom Score: 9/132  Previous Total # of Symptoms: 16/22 Previous Symptom Score: 41/132  Tinnitus: No  Review of Systems: No fevers or chills.    Review of History: Patient has never been diagnosed with ADHD but suspects that he has had it his entire life.  Objective:    Physical Examination Vitals:   09/24/23 1007  BP: 118/74  Pulse: (!) 55  SpO2: 96%   MSK: Normal cervical motion Neuro: Alert and oriented normal coordination and gait Psych: Normal speech and affect.  Patient is a bit tangential.     Assessment and Plan   67 y.o. male with improving concussion symptoms.   Still having a little bit of cognitive issues but they are much better than he used to be.  He has another month scheduled of cognitive rehab which I think will be helpful.  I do not think he is going to need further medical care for me at this point.  Happy to reschedule with me if needed.  We did talk a bit about potential next steps including ADHD type medications.  They could be beneficial for him if his symptoms are bad enough to warrant them.  He does not think it is that bad and would generally like to avoid that class of medicines if possible which I agree with.  Recheck back as needed.       Action/Discussion: Reviewed diagnosis, management options, expected outcomes, and the reasons for scheduled and emergent follow-up. Questions were adequately answered. Patient expressed verbal understanding and agreement with the following plan.     Patient Education: Reviewed with patient the risks (i.e, a repeat concussion, post-concussion syndrome, second-impact syndrome) of returning to play prior to complete resolution, and thoroughly reviewed the signs and symptoms of concussion.Reviewed need for complete resolution of all symptoms, with rest AND exertion, prior to return to play. Reviewed red flags for urgent medical evaluation: worsening symptoms, nausea/vomiting, intractable headache, musculoskeletal changes, focal neurological deficits. [facility] Concussion Care Plan, which clearly outlines the plans stated above, was given to patient.   Level of service: Total encounter time 30 minutes including  face-to-face time with the patient and, reviewing past medical record, and charting on the date of service.        After Visit Summary printed out and provided to patient as appropriate.  The above documentation has been reviewed and is accurate and complete Lucas Shaffer

## 2023-09-24 NOTE — Patient Instructions (Addendum)
 Thank you for coming in today.   Check back as needed!  Have fun at wrestling!!

## 2023-09-28 ENCOUNTER — Ambulatory Visit: Attending: Family Medicine

## 2023-09-28 DIAGNOSIS — R41841 Cognitive communication deficit: Secondary | ICD-10-CM | POA: Diagnosis not present

## 2023-09-28 NOTE — Therapy (Signed)
 OUTPATIENT SPEECH LANGUAGE PATHOLOGY TREATMENT/DISCHARGE SUMMARY   Patient Name: Lucas Shaffer. MRN: [id number] DOB:26-Oct-1955, 68 y.o., male Today's Date: 09/28/2023  PCP: Daria Pastures, PA REFERRING PROVIDER: Rodolph Bong, MD  END OF SESSION:  End of Session - 09/28/23 0936     Visit Number 7    Number of Visits 17    Date for SLP Re-Evaluation 11/05/23    SLP Start Time 0851    SLP Stop Time  0925    SLP Time Calculation (min) 34 min    Activity Tolerance Patient tolerated treatment well                 Past Medical History:  Diagnosis Date   Arthritis    GERD (gastroesophageal reflux disease)    H/O adenomatous polyp of colon    last colonoscopy reported to be in 2023 with surveillance advised 2026   Hyperlipidemia    Hypertension    Past Surgical History:  Procedure Laterality Date   CHOLECYSTECTOMY     ESOPHAGOGASTRODUODENOSCOPY (EGD) WITH PROPOFOL N/A 07/08/2023   Procedure: ESOPHAGOGASTRODUODENOSCOPY (EGD) WITH PROPOFOL;  Surgeon: Corbin Ade, MD;  Location: [facility];  Service: Endoscopy;  Laterality: N/A;  8:15 am, asa 2   MALONEY DILATION N/A 07/08/2023   Procedure: MALONEY DILATION;  Surgeon: Corbin Ade, MD;  Location: [facility];  Service: Endoscopy;  Laterality: N/A;   VASECTOMY     Patient Active Problem List   Diagnosis Date Noted   Hypogonadism in male 07/12/2023   Low testosterone 07/01/2023   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 Pt entered ST on 09/28/23 stating he was primarily at  baseline and requested d/c. Pt's PROM improved dramatically from initial adminstration.   Remaining deficits: None, per pt.    Education / Equipment: See individual therapy session notes for details.    Patient agrees to discharge. Patient goals were partially met. Patient is being discharged due to the patient's request..      ONSET DATE: 07/30/23   REFERRING DIAG:  B28.0X9A (ICD-10-CM) - Concussion with loss of consciousness, initial encounter    THERAPY DIAG:  Cognitive communication deficit  Rationale for Evaluation and Treatment: Rehabilitation  SUBJECTIVE:   SUBJECTIVE STATEMENT: "I really feel like I'm back where I used to be."  Pt accompanied by: self  PERTINENT HISTORY:  Pt going down the stairs, his dog pulled him, and falling down 5-10 stairs, hitting his head. He was transported by EMS to the [facility] ED. Pt was seen again at the ED on 08/03/23.  PAIN:  Are you having pain? No  FALLS: Has patient fallen in last 6 months?  Yes, Number of falls: 1   PATIENT GOALS: Improve in deficit areas  OBJECTIVE:  Note: Objective measures were completed at Evaluation unless otherwise noted.  DIAGNOSTIC FINDINGS:  CT Head Wo Contrast Result Date: 07/30/2023 CLINICAL DATA:  Facial trauma, blunt EXAM: CT HEAD WITHOUT CONTRAST TECHNIQUE: Contiguous axial images were obtained from the base of the skull through the vertex without intravenous contrast. RADIATION DOSE REDUCTION: This exam was performed according to the  departmental dose-optimization program which includes automated exposure control, adjustment of the mA and/or kV according to patient size and/or use of iterative reconstruction technique. COMPARISON:  None Available. FINDINGS: Brain: No evidence of acute infarction, hemorrhage, hydrocephalus, extra-axial collection or mass lesion/mass effect. Minimal low-density changes within the periventricular and subcortical white matter most compatible with chronic  microvascular ischemic change. Mild diffuse cerebral volume loss. Vascular: Atherosclerotic calcifications involving the large vessels of the skull base. No unexpected hyperdense vessel. Skull: Normal. Negative for fracture or focal lesion. Sinuses/Orbits: No acute finding. Other: Soft tissue laceration overlying the anterior right frontal region. Degenerative changes of the left temporomandibular joint. IMPRESSION: 1. No acute intracranial findings. 2. Soft tissue laceration overlying the anterior right frontal region. No underlying calvarial fracture.  68 y.o. male with improving concussion symptoms.  Still having a little bit of cognitive issues but they are much better than he used to be.  He has another month scheduled of cognitive rehab which I think will be helpful.  I do not think he is going to need further medical care for me at this point.  Happy to reschedule with me if needed.  We did talk a bit about potential next steps including ADHD type medications.  They could be beneficial for him if his symptoms are bad enough to warrant them.  He does not think it is that bad and would generally like to avoid that class of medicines if possible which I agree with.  Recheck back as needed.   STANDARDIZED ASSESSMENTS: Cognitive Linguistic Quick Test: (CLQT): Completed today. The CLQT was administered to assess the relative status of five cognitive domains: attention, memory, language, executive functioning, and visuospatial skills. Scores from 10 tasks were used to estimate severity ratings (standardized for age groups 18-69 years and 70-89 years) for each domain, a clock drawing task, as well as an overall composite severity rating of cognition.      Task Score Criterion Cut Scores  Personal Facts 8/8 8  Symbol Cancellation 10/12 11  Confrontation Naming 10/10 10  Clock Drawing  11/13 12  Story Retelling 8/10 6  Symbol Trails 10/10 9  Generative Naming 6/9 5  Design Memory 3/6 5  Mazes  8/8 7   Design Generation 5/13 6   Cognitive Domain Composite Score Severity Rating  Attention 189/215 WNL  Memory 140/185 Moderate  Executive Function 29/40 WNL  Language 32/37 WNL  Visuospatial Skills 81/105 Mild  Clock Drawing  11/13 Mild  Composite Severity Rating 3.4 Mild   Pt demonstrated deficits in attention to detail today as well as a difficulty in processing information. This was seen in decr'd scores in symbol cancellation, clock drawing, naming, memory for designs, and design generation. SLP would postulate that memory score was decr'd more due to processing for design memory and in generative naming for /m/ words, specifically (due to rules associated with this task).    PATIENT REPORTED OUTCOME MEASURES (PROM): Cognitive Function: Pt scored himself 103/140, with higher scores indicating lesser impact of cognitive ability on everyday life  TREATMENT DATE:   09/28/23: "Dr Denyse Amass released me, and I really feel good now. Candy and Casimiro Needle both think its better than last week. Marland KitchenMarland KitchenMarland KitchenI definitely feel better about myself - I'm not scared anymore." Pt requested d/c today due to reported return to baseline. Pt's PROM today was 125/140, improved from initial administration. Homework was returned 80% complete. Pt described medication administration and pt is independent with this now without any errors reported to SLP in the last two weeks; he has filled his medbox as well without errors reported to SLP. At work, he states he is recalling things much as he did prior to concussion. He tells SLP the same is going on at home. "I always forgot stuff, it wasn't perfect" he told SLP. Pt conveys to SLP that he no longer has to use GPS for trailmaking, like he reports he had to prior to ST. Gives SLP Sunday in [place] as an example.   09/24/23: SLP guided pt through detailed task, with  extra time pt completed this with 100% success. He states that his memory appears better, and remembered to ask Candy (and report info back to SLP) who stated memory is a little better or the same. The work Animator who lives with [place] states he has noted it is better. Homework provided.  09/20/23: Pt has positioned his meds in a more central location. Pt will ask Candy and Casimiro Needle about his memory- if it's different than 6 months ago. He does not think it's different. SLP asked pt about focus and pt told him sx of decr'd alternating attention on the job or at home (talking with friend and talking to and petting the dog, then losing train of thought. SLP guided pt through simple written task and answering yes/no and simple wh questions. He did so in this setting (simple written task and simple auditory information) with overall 93% accuracy and good accuracy with verbal answering given auditory information SLP presented him with. Pt to cont with detailed instructions/directions and tasks with homework provided today.   09/14/23: Pt brought his homework back - pt self corrected, for 100% accuracy. Pt stated he forgot a doctor's appointment yesterday so SLP provided him with memory strategies (see "pt instructions"). "One thing I could do, and I've been thinking about this, is to use a calendar for appointments." Pt is using pillbox, which pt fills himself. He has had one or two skips in the last 4 weeks so SLP suggested an alarm in his phone. Pt stated it would be a good idea, but he just took his meds when he arrived home. Tiara seemed less-concerned about this today than he did at time of evaluation. For the last portion of therapy SLP assisted pt putting his appointments into his phone. When asked, pt stated he didn't think he needed to self correct, but SLP saw him make mistake and encouraged him to look over his work. He found his error when doing so. Homework provided for detailed work.  09/10/23: Pt  brought homework in, which he stated he double checked. He made two errors (overall accuracy 97%). See "S" above. SLP went through a detailed task with pt and he req'd cues for processing speed. He req'd cue for flexibility of thought, and agreed that prior to concussion he would have completed this task sooner than it took him during this session. Homework provided.  09/07/23: Pt will complete PROM and receive memory strategies education next session. Completed CLQT. Results above. SLP led pt through correction  of his homework and pt found none of his 4 incorrect answers. Overall accuracy 94%. SLP suggested pt have someone Patent attorney) take a second look at something at work if it is detailed, and pt stated this is already occurring - even prior to the fall. SLP reiterated the importance of this.   09/03/23 (eval date): n/a  PATIENT EDUCATION: Education details: See "treatment date" this date Person educated: Patient Education method: Explanation Education comprehension: verbalized understanding   GOALS: Goals reviewed with patient? Yes, in general  SHORT TERM GOALS: Target date: 10/01/23  Ora will complete CLQT in first therapy session Baseline: Goal status: Met  2.   Pt will tell SLP 3 trained compensatory strategies he could use for cognitive communication deficits Baseline:  Goal status: not met  3.   Pt will demo use of one trained compensatory strategy, with min A to use, in 2 sessions Baseline:  Goal status: not met  4.  Pt will complete functional detailed linguistic tasks with 100% success after self-correction and rare min questioning cues in 3 sessions Baseline: 09/24/23 Goal status: partially met  5. Pt will begin to use a memory compensation technique at work successfully. Baseline: 09/24/23 Goal status: partially met   LONG TERM GOALS: Target date: 11/05/23  Labrandon will improve PROM score compared to initial administration Baseline:  Goal status:  met  2.  Pt will demo use of two trained compensatory strategies in 2 sessions Baseline:  Goal status: not met  3.  Pt will complete functional mod complex linguistic tasks with 100% success after self-correction in 3 sessions Baseline:  Goal status: not met  4.  Pt will track medication administration correctly for 14 consecutive days Baseline:  Goal status: not met  ASSESSMENT:  CLINICAL IMPRESSION: Patient is a 68 y.o. M who was seen today for treatment of cognitive communication skills following a concussive event on 07/30/23. See "treatment date' for details on today's session. Today he asked for d/c due to reported return to baseline, even though Dr. Zollie Pee note states he thinks it would be good to continue out his plan of care. His PROM improved today when administered. Many goals were note met due to pt early d/c.  OBJECTIVE IMPAIRMENTS: include attention, memory, and executive functioning. These impairments are limiting patient from managing medications, household responsibilities, ADLs/IADLs, and effectively communicating at home and in community. Factors affecting potential to achieve goals and functional outcome are  none noted today .Marland Kitchen Patient will benefit from skilled SLP services to address above impairments and improve overall function.  REHAB POTENTIAL: Excellent  PLAN:  Discharge today   PLANNED INTERVENTIONS: Environmental controls, Cognitive reorganization, Internal/external aids, Functional tasks, SLP instruction and feedback, Compensatory strategies, Patient/family education, and [address]81 Treatment of speech (30 or 45 min)    Referring diagnosis? X91.4N8G (ICD-10-CM) - Concussion with loss of consciousness, initial encounter Treatment diagnosis? (if different than referring diagnosis) R41.841 Cognitive communication deficit What was this (referring dx) caused by? []  Surgery [x]  Fall []  Ongoing issue []  Arthritis []  Other: ____________   Check all possible  CPT codes:  *CHOOSE 10 OR LESS*    See Planned Interventions listed in the Plan section of the Evaluation.    Asencion Guisinger, CCC-SLP 09/28/2023, 9:37 AM

## 2023-10-01 ENCOUNTER — Ambulatory Visit

## 2023-10-05 DIAGNOSIS — Z6831 Body mass index (BMI) 31.0-31.9, adult: Secondary | ICD-10-CM | POA: Diagnosis not present

## 2023-10-05 DIAGNOSIS — J309 Allergic rhinitis, unspecified: Secondary | ICD-10-CM | POA: Diagnosis not present

## 2023-10-05 DIAGNOSIS — G4733 Obstructive sleep apnea (adult) (pediatric): Secondary | ICD-10-CM | POA: Diagnosis not present

## 2023-10-08 ENCOUNTER — Encounter

## 2023-10-12 ENCOUNTER — Encounter

## 2023-10-14 ENCOUNTER — Encounter: Payer: Self-pay | Admitting: Gastroenterology

## 2023-10-22 ENCOUNTER — Encounter

## 2023-10-29 ENCOUNTER — Encounter

## 2023-11-20 ENCOUNTER — Telehealth: Payer: Self-pay | Admitting: Gastroenterology

## 2023-11-20 NOTE — Telephone Encounter (Signed)
 PLEASE LET PT KNOW WE REVIEWED [place]  RECORDS. HE IS DUE COLONOSCOPY 01/2025, WE WILL NIC.  PLEASE PLACE RECALL FOR COLONOSCOPY 01/2025.       Procedures done in [place]  01/2022: Colonoscopy:  Left sided small and medium mouthed diverticulosis  Small rectum polyp removed with cold snare  Splenic flexure polyp removed by cold jumbo biopsy  Transverse colon polyp removed by cold jumbo biopsy  Multiple small and medium sized polyps in ascending colon removed by combination of cold and hot snare polypectomy  2 small polyps in cecum removed by cold snare --->5 polyps- 4 TA the largest 2.5cm; one hyperplastic.  Repeat procedure in 3 years.   EGD: Z line at 41cm  4 cm hiatal hernia  No gross lesions in stomach (biopsied) No gross lesions in stomach (biopsied) or duodenal bulb/2nd portion  PATH A. Stomach, biopsy: - Minimal chronic gastritis. - No intestinal metaplasia identified. - No H pylori identified on H&E and immunostain.   B. Esophagus, biopsy: - Squamous mucosa with no significant pathologic change. - No intestinal metaplasia identified.   C. Transverse colon, biopsy: - Tubular adenoma.   D. Ascending colon, biopsy: - Fragmented tubular adenoma.   E. Cecum, biopsy: - Tubular adenoma.   F. Colon, splenic flexure, biopsy: - Tubular adenoma.   G. Rectum, biopsy: - Hyperplastic polyp.

## 2023-11-23 NOTE — Telephone Encounter (Signed)
 Pt was made aware and verbalized understanding.   MANDY/LADONNA: Please NIC Colonoscopy for 01/2025

## 2023-11-30 ENCOUNTER — Ambulatory Visit
Admission: EM | Admit: 2023-11-30 | Discharge: 2023-11-30 | Disposition: A | Discharge status: Home / Self Care | Attending: Family Medicine | Admitting: Family Medicine

## 2023-11-30 DIAGNOSIS — K529 Noninfective gastroenteritis and colitis, unspecified: Secondary | ICD-10-CM

## 2023-11-30 MED ORDER — ONDANSETRON 4 MG PO TBDP: 4.0000 mg | ORAL_TABLET | Freq: Three times a day (TID) | ORAL | 0 refills | Status: AC | PRN

## 2023-11-30 MED ORDER — LOPERAMIDE HCL 2 MG PO CAPS: 2.0000 mg | ORAL_CAPSULE | Freq: Two times a day (BID) | ORAL | 0 refills | Status: AC | PRN

## 2023-11-30 NOTE — ED Provider Notes (Signed)
 [facility]  Note:  This document was prepared using Conservation officer, historic buildings and may include unintentional dictation errors.  MRN: [id number] DOB: 03-16-1956  Subjective:   Lucas Shaffer. is a 68 y.o. male with pmh of GERD presenting for 2-day history of malaise, fatigue, abdominal discomfort, nausea without vomiting, loose stools.  No sinus symptoms, respiratory symptoms, cough, bloody stools, constipation, rashes, urinary symptoms.  Has a history of GERD. No fever, recent antibiotic use, hospitalizations or long distance travel.  Has not eaten raw foods, drank unfiltered water.  No history of Crohn's, IBS, ulcerative colitis.   No current facility-administered medications for this encounter.  Current Outpatient Medications:    aspirin EC 81 MG tablet, Take 81 mg by mouth daily. Swallow whole., Disp: , Rfl:    atenolol (TENORMIN) 100 MG tablet, Take 100 mg by mouth daily., Disp: , Rfl:    cetirizine (ZYRTEC) 10 MG tablet, Take 10 mg by mouth daily., Disp: , Rfl:    chlorthalidone (HYGROTON) 50 MG tablet, Take 50 mg by mouth daily., Disp: , Rfl:    esomeprazole (NEXIUM) 40 MG capsule, Take 40 mg by mouth daily at 12 noon., Disp: , Rfl:    fluticasone (FLONASE) 50 MCG/ACT nasal spray, Place 2 sprays into both nostrils daily., Disp: , Rfl:    ibuprofen  (ADVIL ) 800 MG tablet, Take 1 tablet (800 mg total) by mouth 3 (three) times daily., Disp: 21 tablet, Rfl: 0   lisinopril -hydrochlorothiazide  (PRINZIDE ,ZESTORETIC ) 20-12.5 MG tablet, Take 1 tablet by mouth daily., Disp: 30 tablet, Rfl: 0   ondansetron  (ZOFRAN ) 4 MG tablet, Take 1 tablet (4 mg total) by mouth every 8 (eight) hours as needed for nausea or vomiting., Disp: 12 tablet, Rfl: 0   Testosterone  20.25 MG/ACT (1.62%) GEL, Place 4 Pump onto the skin daily., Disp: 150 g, Rfl: 5   No Known Allergies  Past Medical History:  Diagnosis Date   Arthritis    GERD (gastroesophageal reflux disease)    H/O  adenomatous polyp of colon    last colonoscopy reported to be in 2023 with surveillance advised 2026   Hyperlipidemia    Hypertension      Past Surgical History:  Procedure Laterality Date   CHOLECYSTECTOMY     ESOPHAGOGASTRODUODENOSCOPY (EGD) WITH PROPOFOL  N/A 07/08/2023   Procedure: ESOPHAGOGASTRODUODENOSCOPY (EGD) WITH PROPOFOL ;  Surgeon: Suzette Espy, MD;  [facility];  Service: Endoscopy;  Laterality: N/A;  8:15 am, asa 2   MALONEY DILATION N/A 07/08/2023   Procedure: MALONEY DILATION;  Surgeon: Suzette Espy, MD;  [facility];  Service: Endoscopy;  Laterality: N/A;   VASECTOMY      Family History  Problem Relation Age of Onset   Dementia Mother     Social History   Tobacco Use   Smoking status: Former    Current packs/day: 1.00    Types: Cigarettes   Smokeless tobacco: Former  Building services engineer status: Never Used  Substance Use Topics   Alcohol  use: Yes    Comment: occ   Drug use: No    ROS   Objective:   Vitals: BP (P) 136/78 (BP Location: Right Arm)   Pulse (!) (P) 55   Temp (P) 97.9 F (36.6 C) (Oral)   Resp (P) 20   SpO2 (P) 96%   Physical Exam Constitutional:      General: He is not in acute distress.    Appearance: Normal appearance. He is well-developed  and normal weight. He is not ill-appearing, toxic-appearing or diaphoretic.  HENT:     Head: Normocephalic and atraumatic.     Right Ear: External ear normal.     Left Ear: External ear normal.     Nose: Nose normal.     Mouth/Throat:     Mouth: Mucous membranes are moist.  Eyes:     General: No scleral icterus.       Right eye: No discharge.        Left eye: No discharge.     Extraocular Movements: Extraocular movements intact.     Conjunctiva/sclera: Conjunctivae normal.  Cardiovascular:     Rate and Rhythm: Normal rate and regular rhythm.     Heart sounds: Normal heart sounds. No murmur heard.    No friction rub. No gallop.  Pulmonary:     Effort:  Pulmonary effort is normal. No respiratory distress.     Breath sounds: Normal breath sounds. No stridor. No wheezing, rhonchi or rales.  Abdominal:     General: Bowel sounds are increased. There is no distension.     Palpations: Abdomen is soft. There is no mass.     Tenderness: There is generalized abdominal tenderness (mild). There is no right CVA tenderness, left CVA tenderness, guarding or rebound.  Skin:    General: Skin is warm and dry.  Neurological:     Mental Status: He is alert and oriented to person, place, and time.  Psychiatric:        Mood and Affect: Mood normal.        Behavior: Behavior normal.        Thought Content: Thought content normal.     Assessment and Plan :   PDMP not reviewed this encounter.  1. Colitis    No signs of an acute abdomen.  Vital signs hemodynamically stable.  Will manage for suspected colitis with supportive care.  Recommended patient hydrate well, eat light meals and maintain electrolytes.  Will use Zofran  and Imodium for nausea, vomiting and diarrhea. Counseled patient on potential for adverse effects with medications prescribed/recommended today, ER and return-to-clinic precautions discussed, patient verbalized understanding.    Adolph Hoop, [place] 11/30/23 [phone]

## 2023-11-30 NOTE — Discharge Instructions (Signed)

## 2023-11-30 NOTE — ED Triage Notes (Signed)
 Pt c/o fatigue, abd pain/nausea, HA started yesterday-NAD-steady gait

## 2024-01-04 DIAGNOSIS — Z0100 Encounter for examination of eyes and vision without abnormal findings: Secondary | ICD-10-CM | POA: Diagnosis not present

## 2024-01-04 DIAGNOSIS — H524 Presbyopia: Secondary | ICD-10-CM | POA: Diagnosis not present

## 2024-01-06 ENCOUNTER — Ambulatory Visit: Admitting: Adult Health

## 2024-03-10 ENCOUNTER — Ambulatory Visit (INDEPENDENT_AMBULATORY_CARE_PROVIDER_SITE_OTHER): Admitting: Pulmonary Disease

## 2024-03-10 VITALS — BP 117/73 | HR 54

## 2024-03-10 DIAGNOSIS — Z87898 Personal history of other specified conditions: Secondary | ICD-10-CM

## 2024-03-10 DIAGNOSIS — G478 Other sleep disorders: Secondary | ICD-10-CM | POA: Diagnosis not present

## 2024-03-10 DIAGNOSIS — R400 Somnolence: Secondary | ICD-10-CM

## 2024-03-10 NOTE — Progress Notes (Signed)
 Lucas Shaffer    [id number]    Oct 01, 1955  Primary Care Physician:Boyd, Elsie RAMAN, PA  Referring Physician: Jolee Elsie RAMAN, PA [address] [place],  [place] [address]  Chief complaint:   Patient being seen for concern for sleep apnea  HPI:  Diagnosed with sleep apnea about 2015 Used CPAP for about 6 months stopped using it because he felt it was getting unkempt, stated he did not get any help with how to use it or how to keep it clean  Noted to snore, nonrestorative sleep, tired during the day Recently had issues with falling asleep during work hours  Usually goes to bed between 10 and 11 Falls asleep easily Wakes up 1 or 2 times during the night His weight is up recently   No family history of obstructive sleep apnea  He does have mouth dryness in the morning No morning headaches Does have night sweats  History of hypertension, hypercholesterolemia, as stated above diagnosed with sleep apnea previously  Reformed smoker quit in 2022  Outpatient Encounter Medications as of 03/10/2024  Medication Sig   aspirin EC 81 MG tablet Take 81 mg by mouth daily. Swallow whole.   atenolol (TENORMIN) 100 MG tablet Take 100 mg by mouth daily.   cetirizine (ZYRTEC) 10 MG tablet Take 10 mg by mouth daily.   chlorthalidone (HYGROTON) 50 MG tablet Take 50 mg by mouth daily.   esomeprazole (NEXIUM) 40 MG capsule Take 40 mg by mouth daily at 12 noon.   fluticasone (FLONASE) 50 MCG/ACT nasal spray Place 2 sprays into both nostrils daily.   ibuprofen  (ADVIL ) 800 MG tablet Take 1 tablet (800 mg total) by mouth 3 (three) times daily.   lisinopril -hydrochlorothiazide  (PRINZIDE ,ZESTORETIC ) 20-12.5 MG tablet Take 1 tablet by mouth daily.   loperamide  (IMODIUM ) 2 MG capsule Take 1 capsule (2 mg total) by mouth 2 (two) times daily as needed for diarrhea or loose stools.   ondansetron  (ZOFRAN -ODT) 4 MG disintegrating tablet Take 1 tablet (4 mg total) by mouth every 8 (eight) hours as  needed for nausea or vomiting.   Testosterone  20.25 MG/ACT (1.62%) GEL Place 4 Pump onto the skin daily.   No facility-administered encounter medications on file as of 03/10/2024.    Allergies as of 03/10/2024   (No Known Allergies)    Past Medical History:  Diagnosis Date   Arthritis    GERD (gastroesophageal reflux disease)    H/O adenomatous polyp of colon    last colonoscopy reported to be in 2023 with surveillance advised 2026   Hyperlipidemia    Hypertension     Past Surgical History:  Procedure Laterality Date   CHOLECYSTECTOMY     ESOPHAGOGASTRODUODENOSCOPY (EGD) WITH PROPOFOL  N/A 07/08/2023   Procedure: ESOPHAGOGASTRODUODENOSCOPY (EGD) WITH PROPOFOL ;  Surgeon: Shaaron Lamar HERO, MD;  [facility];  Service: Endoscopy;  Laterality: N/A;  8:15 am, asa 2   MALONEY DILATION N/A 07/08/2023   Procedure: MALONEY DILATION;  Surgeon: Shaaron Lamar HERO, MD;  [facility];  Service: Endoscopy;  Laterality: N/A;   VASECTOMY      Family History  Problem Relation Age of Onset   Dementia Mother     Social History   Socioeconomic History   Marital status: Single    Spouse name: Not on file   Number of children: Not on file   Years of education: Not on file   Highest education level: Not on file  Occupational  History   Not on file  Tobacco Use   Smoking status: Former    Current packs/day: 1.00    Types: Cigarettes   Smokeless tobacco: Former  Building services engineer status: Never Used  Substance and Sexual Activity   Alcohol  use: Yes    Comment: occ   Drug use: No   Sexual activity: Yes    Birth control/protection: Spermicide  Other Topics Concern   Not on file  Social History Narrative   Not on file   Social Drivers of Health   Financial Resource Strain: Not on file  Food Insecurity: Not on file  Transportation Needs: Not on file  Physical Activity: Not on file  Stress: Not on file  Social Connections: Not on file  Intimate Partner Violence:  Not on file    Review of Systems  Respiratory:  Positive for apnea.   Psychiatric/Behavioral:  Positive for sleep disturbance.     There were no vitals filed for this visit.   Physical Exam Constitutional:      Appearance: Normal appearance.  HENT:     Head: Normocephalic.     Mouth/Throat:     Mouth: Mucous membranes are moist.  Eyes:     General: No scleral icterus. Cardiovascular:     Rate and Rhythm: Normal rate and regular rhythm.     Heart sounds: No murmur heard.    No friction rub.  Pulmonary:     Effort: No respiratory distress.     Breath sounds: No stridor. No wheezing or rhonchi.  Musculoskeletal:     Cervical back: No rigidity or tenderness.  Neurological:     Mental Status: He is alert.  Psychiatric:        Mood and Affect: Mood normal.       03/10/2024   11:00 AM  Results of the Epworth flowsheet  Sitting and reading 1  Watching TV 2  Sitting, inactive in a public place (e.g. a theatre or a meeting) 3  As a passenger in a car for an hour without a break 0  Lying down to rest in the afternoon when circumstances permit 2  Sitting and talking to someone 0  Sitting quietly after a lunch without alcohol  1  In a car, while stopped for a few minutes in traffic 0  Total score 9     Data Reviewed: Previous sleep study result not available to be reviewed  Assessment/Plan: History of obstructive sleep apnea - Previously on CPAP therapy - Discussed evaluation and management including CPAP therapy, an inspire device  He does feel that he may want to have an inspire device as his option of treatment of sleep disordered breathing, I did explain to him about qualification for inspire device - Still needs to try CPAP and fail CPAP to qualify  Encouraged to continue weight loss efforts  Pathophysiology of sleep disordered breathing discussed Treatment options discussed  Risk with not treating sleep disordered breathing discussed  Daytime sleepiness is  likely related to untreated sleep disordered breathing  Tentative follow-up in about 3 months  Jennet Epley MD [facility] 03/10/2024, 11:16 AM  CC: Jolee Elsie RAMAN, PA

## 2024-03-10 NOTE — Patient Instructions (Addendum)
 We will schedule you for home sleep test  We will update your results once reviewed  Call us  with significant concerns  Look up inspire sleep-this will give you more information about the device  Tentative follow-up in about 3 months  Call us  with significant concerns

## 2024-03-28 ENCOUNTER — Encounter

## 2024-03-28 DIAGNOSIS — R400 Somnolence: Secondary | ICD-10-CM

## 2024-03-28 DIAGNOSIS — G478 Other sleep disorders: Secondary | ICD-10-CM

## 2024-03-28 DIAGNOSIS — Z87898 Personal history of other specified conditions: Secondary | ICD-10-CM

## 2024-03-28 DIAGNOSIS — G4730 Sleep apnea, unspecified: Secondary | ICD-10-CM | POA: Diagnosis not present

## 2024-04-04 DIAGNOSIS — J309 Allergic rhinitis, unspecified: Secondary | ICD-10-CM | POA: Diagnosis not present

## 2024-04-04 DIAGNOSIS — Z1329 Encounter for screening for other suspected endocrine disorder: Secondary | ICD-10-CM | POA: Diagnosis not present

## 2024-04-04 DIAGNOSIS — Z6832 Body mass index (BMI) 32.0-32.9, adult: Secondary | ICD-10-CM | POA: Diagnosis not present

## 2024-04-04 DIAGNOSIS — K219 Gastro-esophageal reflux disease without esophagitis: Secondary | ICD-10-CM | POA: Diagnosis not present

## 2024-04-04 DIAGNOSIS — G4733 Obstructive sleep apnea (adult) (pediatric): Secondary | ICD-10-CM | POA: Diagnosis not present

## 2024-04-04 DIAGNOSIS — N401 Enlarged prostate with lower urinary tract symptoms: Secondary | ICD-10-CM | POA: Diagnosis not present

## 2024-04-04 DIAGNOSIS — I10 Essential (primary) hypertension: Secondary | ICD-10-CM | POA: Diagnosis not present

## 2024-04-04 DIAGNOSIS — E782 Mixed hyperlipidemia: Secondary | ICD-10-CM | POA: Diagnosis not present

## 2024-04-04 DIAGNOSIS — E7849 Other hyperlipidemia: Secondary | ICD-10-CM | POA: Diagnosis not present

## 2024-04-10 ENCOUNTER — Telehealth: Payer: Self-pay | Admitting: Pulmonary Disease

## 2024-04-10 DIAGNOSIS — G4733 Obstructive sleep apnea (adult) (pediatric): Secondary | ICD-10-CM | POA: Diagnosis not present

## 2024-04-10 NOTE — Telephone Encounter (Signed)
 Call patient  Sleep study result  Date of study: 03/28/2024  Impression: Mild obstructive sleep apnea with mild oxygen desaturations AHI of 9.0 with O2 nadir of 85% Saturations below 88% for 7 minutes, 2% of recording.  Recommendation: Options for treating mild obstructive sleep apnea may include CPAP therapy if there are significant daytime symptoms or notable comorbidities. Auto CPAP 5-15 with heated humidification and the patient's preferred mask may be considered; other treatment options may include an oral device, watchful waiting with significant weight loss efforts.  Close clinical follow-up optimization of treatment

## 2024-04-12 NOTE — Telephone Encounter (Signed)
 I called and spoke with the Lucas Shaffer and notified of response from Dr. Neda. He had many questions and wanted appt to discuss further. Appt scheduled for mid Nov per his request. Nothing further needed.

## 2024-04-12 NOTE — Telephone Encounter (Signed)
 Called the pt and there was no answer- LMTCB

## 2024-05-16 DIAGNOSIS — R7989 Other specified abnormal findings of blood chemistry: Secondary | ICD-10-CM | POA: Diagnosis not present

## 2024-05-16 DIAGNOSIS — I10 Essential (primary) hypertension: Secondary | ICD-10-CM | POA: Diagnosis not present

## 2024-05-16 DIAGNOSIS — Z130 Encounter for screening for diseases of the blood and blood-forming organs and certain disorders involving the immune mechanism: Secondary | ICD-10-CM | POA: Diagnosis not present

## 2024-05-18 ENCOUNTER — Ambulatory Visit: Admitting: Adult Health

## 2024-06-01 ENCOUNTER — Ambulatory Visit: Payer: Self-pay | Admitting: Pulmonary Disease

## 2024-06-09 ENCOUNTER — Encounter: Payer: Self-pay | Admitting: Pulmonary Disease

## 2024-06-09 ENCOUNTER — Ambulatory Visit: Admitting: Pulmonary Disease

## 2024-06-09 VITALS — BP 131/79 | HR 56 | Ht 73.0 in | Wt 239.0 lb

## 2024-06-09 DIAGNOSIS — I10 Essential (primary) hypertension: Secondary | ICD-10-CM | POA: Diagnosis not present

## 2024-06-09 DIAGNOSIS — E7800 Pure hypercholesterolemia, unspecified: Secondary | ICD-10-CM

## 2024-06-09 DIAGNOSIS — R400 Somnolence: Secondary | ICD-10-CM

## 2024-06-09 DIAGNOSIS — G4733 Obstructive sleep apnea (adult) (pediatric): Secondary | ICD-10-CM

## 2024-06-09 NOTE — Patient Instructions (Signed)
 Your sleep study is positive as we reviewed  We will contact the medical supply company to start you on a CPAP  DME referral for auto CPAP 5-15 with heated humidification with patient's mask of choice - Nasal pillows or a nose mask will be appropriate  Tentative follow-up within about 6 to 8 weeks  Call us  with significant concerns  Make sure you continue to stay active 18

## 2024-06-09 NOTE — Progress Notes (Signed)
 Lucas Shaffer    [id number]    22-Nov-1955  Primary Care Physician:Boyd, Elsie RAMAN, PA  Referring Physician: Jolee Elsie RAMAN, PA [address] [place],  [place] [address]  Chief complaint:   Follow-up following a sleep study  Discussed the use of AI scribe software for clinical note transcription with the patient, who gave verbal consent to proceed.  History of Present Illness Lucas Shaffer. is a 68 year old male who presents with sleep apnea and daytime fatigue.  He experiences significant daytime fatigue and sleepiness, feeling tired throughout the day regardless of the amount of sleep he gets at night. He typically goes to bed around 11 PM and wakes up at 5 AM, initially feeling fine but later in the day feels the need to sleep for half of the day.  He has a history of using CPAP in the past but found the full face mask uncomfortable and disliked the equipment. A recent sleep study showed nine events per hour and oxygen levels dropping to 85%, with seven minutes below 88%. He wakes up frequently to use the bathroom at night.  He is edentulous, which limits the use of certain oral devices for sleep apnea. He is open to trying different CPAP masks, particularly those that are less intrusive, such as a nose mask.  His health has been relatively okay Still wakes up tired Sometimes does wake up rested but a few hours later will start feeling sleepy  Diagnosed with sleep apnea about 2015, used CPAP for about 6 months  Does have some mild dryness in the morning No morning headaches No night sweats  He does have a history of hypertension, hypercholesterolemia   Outpatient Encounter Medications as of 06/09/2024  Medication Sig   aspirin EC 81 MG tablet Take 81 mg by mouth daily. Swallow whole.   atenolol (TENORMIN) 100 MG tablet Take 100 mg by mouth daily.   cetirizine (ZYRTEC) 10 MG tablet Take 10 mg by mouth daily.   chlorthalidone (HYGROTON) 50 MG tablet Take 50  mg by mouth daily.   esomeprazole (NEXIUM) 40 MG capsule Take 40 mg by mouth daily at 12 noon.   fluticasone (FLONASE) 50 MCG/ACT nasal spray Place 2 sprays into both nostrils daily.   ibuprofen  (ADVIL ) 800 MG tablet Take 1 tablet (800 mg total) by mouth 3 (three) times daily.   lisinopril -hydrochlorothiazide  (PRINZIDE ,ZESTORETIC ) 20-12.5 MG tablet Take 1 tablet by mouth daily.   loperamide  (IMODIUM ) 2 MG capsule Take 1 capsule (2 mg total) by mouth 2 (two) times daily as needed for diarrhea or loose stools.   ondansetron  (ZOFRAN -ODT) 4 MG disintegrating tablet Take 1 tablet (4 mg total) by mouth every 8 (eight) hours as needed for nausea or vomiting.   Testosterone  20.25 MG/ACT (1.62%) GEL Place 4 Pump onto the skin daily.   No facility-administered encounter medications on file as of 06/09/2024.    Allergies as of 06/09/2024   (No Known Allergies)    Past Medical History:  Diagnosis Date   Arthritis    GERD (gastroesophageal reflux disease)    H/O adenomatous polyp of colon    last colonoscopy reported to be in 2023 with surveillance advised 2026   Hyperlipidemia    Hypertension     Past Surgical History:  Procedure Laterality Date   CHOLECYSTECTOMY     ESOPHAGOGASTRODUODENOSCOPY (EGD) WITH PROPOFOL  N/A 07/08/2023   Procedure: ESOPHAGOGASTRODUODENOSCOPY (EGD) WITH PROPOFOL ;  Surgeon: Shaaron Lamar HERO, MD;  [facility];  Service: Endoscopy;  Laterality: N/A;  8:15 am, asa 2   MALONEY DILATION N/A 07/08/2023   Procedure: MALONEY DILATION;  Surgeon: Shaaron Lamar HERO, MD;  [facility];  Service: Endoscopy;  Laterality: N/A;   VASECTOMY      Family History  Problem Relation Age of Onset   Dementia Mother     Social History   Socioeconomic History   Marital status: Single    Spouse name: Not on file   Number of children: Not on file   Years of education: Not on file   Highest education level: Not on file  Occupational History   Not on file  Tobacco Use    Smoking status: Former    Current packs/day: 1.00    Types: Cigarettes   Smokeless tobacco: Former  Building Services Engineer status: Never Used  Substance and Sexual Activity   Alcohol  use: Yes    Comment: occ   Drug use: No   Sexual activity: Yes    Birth control/protection: Spermicide  Other Topics Concern   Not on file  Social History Narrative   Not on file   Social Drivers of Health   Tobacco Use: Medium Risk (06/09/2024)   Patient History    Smoking Tobacco Use: Former    Smokeless Tobacco Use: Former    Passive Exposure: Not on Stage Manager: Not on Ship Broker Insecurity: Not on file  Transportation Needs: Not on file  Physical Activity: Not on file  Stress: Not on file  Social Connections: Not on file  Intimate Partner Violence: Not on file  Depression (PHQ2-9): Not on file  Alcohol  Screen: Not on file  Housing: Not on file  Utilities: Not on file  Health Literacy: Not on file    Review of Systems  Respiratory:  Positive for apnea.   Psychiatric/Behavioral:  Positive for sleep disturbance.     Vitals:   06/09/24 0925  BP: 131/79  Pulse: (!) 56  SpO2: 98%     Physical Exam Constitutional:      Appearance: He is obese.  HENT:     Head: Normocephalic.     Nose: Nose normal.     Mouth/Throat:     Mouth: Mucous membranes are moist.  Eyes:     General: No scleral icterus. Cardiovascular:     Rate and Rhythm: Normal rate and regular rhythm.     Heart sounds: No murmur heard.    No friction rub.  Pulmonary:     Effort: No respiratory distress.     Breath sounds: No stridor. No wheezing or rhonchi.  Musculoskeletal:     Cervical back: No rigidity or tenderness.  Neurological:     Mental Status: He is alert.  Psychiatric:        Mood and Affect: Mood normal.    Data Reviewed: Sleep study was reviewed with the patient showing mild obstructive sleep apnea with AHI of 9  Assessment and Plan Assessment & Plan Obstructive sleep  apnea Mild obstructive sleep apnea with 9 events per hour and oxygen desaturation to 85%, with 7 minutes below 88%. Symptoms include daytime fatigue and frequent nocturnal awakenings. Previous CPAP use was poorly tolerated due to discomfort with full face mask. Current sleep study rules out Inspire therapy due to insufficient event frequency. CPAP therapy is considered beneficial to improve sleep quality and reduce cardiovascular, cerebral, and renal risks associated with untreated sleep apnea. He is willing  to try CPAP therapy despite previous discomfort with full face mask. - Initiated CPAP therapy with a nasal mask. - Coordinated with a medical supply company for CPAP machine and mask fitting. - Scheduled follow-up appointment within 30 to 90 days after starting CPAP therapy to assess tolerance and effectiveness.   Will contact DME to initiate CPAP therapy AutoSet 5-15 with heated humidification with mask of choice Nasal pillows or in nose mask would be appropriate  Tentative follow-up in about 6 to 8 weeks  Hypertension - Controlled  Hypercholesterolemia - Controlled    No orders of the defined types were placed in this encounter.     Lucas Epley MD [facility] 06/09/2024, 9:43 AM  CC: Jolee Elsie RAMAN, PA

## 2024-06-18 ENCOUNTER — Ambulatory Visit
Admission: EM | Admit: 2024-06-18 | Discharge: 2024-06-18 | Disposition: A | Discharge status: Home / Self Care | Attending: Family Medicine | Admitting: Family Medicine

## 2024-06-18 DIAGNOSIS — J208 Acute bronchitis due to other specified organisms: Secondary | ICD-10-CM | POA: Diagnosis not present

## 2024-06-18 DIAGNOSIS — B9689 Other specified bacterial agents as the cause of diseases classified elsewhere: Secondary | ICD-10-CM

## 2024-06-18 LAB — POC COVID19/FLU A&B COMBO
Covid Antigen, POC: NEGATIVE
Influenza A Antigen, POC: NEGATIVE
Influenza B Antigen, POC: NEGATIVE

## 2024-06-18 MED ORDER — PROMETHAZINE-DM 6.25-15 MG/5ML PO SYRP: 2.5000 mL | ORAL_SOLUTION | Freq: Three times a day (TID) | ORAL | 0 refills | Status: AC | PRN

## 2024-06-18 MED ORDER — AZITHROMYCIN 250 MG PO TABS: ORAL_TABLET | ORAL | 0 refills | Status: AC

## 2024-06-18 NOTE — ED Triage Notes (Signed)
 Pt c/o of chills, possible fever, fatigue since Friday. Pt reports some nasal congestion and nausea. Pt did have a mild cough with phlegm this morning. Pt denies any sore throat. NAD. Pt has not taken any medications for his symptoms.

## 2024-06-18 NOTE — Discharge Instructions (Addendum)
 Start azithromycin  for your bronchitis. For sore throat or cough try using a honey-based tea. Use 3 teaspoons of honey with juice squeezed from half lemon. Place shaved pieces of ginger into 1/2-1 cup of water and warm over stove top. Then mix the ingredients and repeat every 4 hours as needed. Please take Tylenol  500mg -650mg  once every 6 hours for fevers, aches and pains. Hydrate very well with at least 2 liters (64 ounces) of water. Eat light meals such as soups (chicken and noodles, chicken wild rice, vegetable).  Do not eat any foods that you are allergic to.  Start an antihistamine like Zyrtec (10mg  daily) for postnasal drainage, sinus congestion.  You can take this together with cough syrup as needed.

## 2024-06-18 NOTE — ED Provider Notes (Signed)
 " Pro[facility], Television/film/video - [facility]  Note:  This document was prepared using Conservation officer, historic buildings and may include unintentional dictation errors.  MRN: [id number] DOB: 1955-11-09  Subjective:   Lucas Shaffer. is a 68 y.o. male presenting for 3-day history of persistent chills, fever, fatigue, chest tightness, sinus congestion, nausea, productive cough.  Reports that he has previously had bronchitis.  Quit smoking about 4 years ago.  Has not taken medications for relief.  Current Outpatient Medications  Medication Instructions   aspirin EC 81 mg, Daily   atenolol (TENORMIN) 100 mg, Daily   cetirizine (ZYRTEC) 10 mg, Daily   chlorthalidone (HYGROTON) 50 mg, Daily   esomeprazole (NEXIUM) 40 mg, Daily   fluticasone (FLONASE) 50 MCG/ACT nasal spray 2 sprays, Daily   ibuprofen  (ADVIL ) 800 mg, Oral, 3 times daily   lisinopril -hydrochlorothiazide  (PRINZIDE ,ZESTORETIC ) 20-12.5 MG tablet 1 tablet, Oral, Daily   loperamide  (IMODIUM ) 2 mg, Oral, 2 times daily PRN   ondansetron  (ZOFRAN -ODT) 4 mg, Oral, Every 8 hours PRN   Testosterone  20.25 MG/ACT (1.62%) GEL 4 Pump, Transdermal, Daily    Allergies[1]  Past Medical History:  Diagnosis Date   Arthritis    GERD (gastroesophageal reflux disease)    H/O adenomatous polyp of colon    last colonoscopy reported to be in 2023 with surveillance advised 2026   Hyperlipidemia    Hypertension      Past Surgical History:  Procedure Laterality Date   CHOLECYSTECTOMY     ESOPHAGOGASTRODUODENOSCOPY (EGD) WITH PROPOFOL  N/A 07/08/2023   Procedure: ESOPHAGOGASTRODUODENOSCOPY (EGD) WITH PROPOFOL ;  Surgeon: Shaaron Lamar HERO, MD;  Location: [facility];  Service: Endoscopy;  Laterality: N/A;  8:15 am, asa 2   MALONEY DILATION N/A 07/08/2023   Procedure: MALONEY DILATION;  Surgeon: Shaaron Lamar HERO, MD;  Location: [facility];  Service: Endoscopy;  Laterality: N/A;   VASECTOMY      Family History  Problem Relation Age of Onset   Dementia  Mother     Social History   Occupational History   Not on file  Tobacco Use   Smoking status: Former    Current packs/day: 1.00    Types: Cigarettes   Smokeless tobacco: Former  Building Services Engineer status: Never Used  Substance and Sexual Activity   Alcohol  use: Yes    Alcohol /week: 1.0 standard drink of alcohol     Types: 1 Cans of beer per week    Comment: occ   Drug use: No   Sexual activity: Yes    Birth control/protection: Spermicide     ROS   Objective:   Vitals: BP 129/87 (BP Location: Right Arm)   Pulse (!) 58   Temp 97.9 F (36.6 C) (Oral)   Resp 18   SpO2 96%   Physical Exam Constitutional:      General: He is not in acute distress.    Appearance: Normal appearance. He is well-developed and normal weight. He is not ill-appearing, toxic-appearing or diaphoretic.  HENT:     Head: Normocephalic and atraumatic.     Right Ear: Tympanic membrane, ear canal and external ear normal. There is no impacted cerumen.     Left Ear: Tympanic membrane, ear canal and external ear normal. There is no impacted cerumen.     Nose: Nose normal.     Mouth/Throat:     Mouth: Mucous membranes are moist.     Pharynx: Oropharynx is clear. No pharyngeal swelling, oropharyngeal exudate, posterior oropharyngeal erythema or uvula swelling.  Tonsils: No tonsillar exudate or tonsillar abscesses. 0 on the right. 0 on the left.  Eyes:     General: No scleral icterus.       Right eye: No discharge.        Left eye: No discharge.     Extraocular Movements: Extraocular movements intact.  Cardiovascular:     Rate and Rhythm: Normal rate and regular rhythm.     Heart sounds: Normal heart sounds. No murmur heard.    No friction rub. No gallop.  Pulmonary:     Effort: Pulmonary effort is normal. No respiratory distress.     Breath sounds: No stridor. Examination of the right-lower field reveals rhonchi. Examination of the left-lower field reveals rhonchi. Rhonchi present. No wheezing  or rales.  Musculoskeletal:     Cervical back: Normal range of motion.  Neurological:     Mental Status: He is alert and oriented to person, place, and time.  Psychiatric:        Mood and Affect: Mood normal.        Behavior: Behavior normal.        Thought Content: Thought content normal.        Judgment: Judgment normal.     Assessment and Plan :   PDMP not reviewed this encounter.  1. Acute bacterial bronchitis      Will cover for bacterial bronchitis with azithromycin , supportive care.  Deferred imaging for now.  Counseled patient on potential for adverse effects with medications prescribed/recommended today, ER and return-to-clinic precautions discussed, patient verbalized understanding.     [1] No Known Allergies    Christopher Savannah, PA-C 06/18/24 1539  "

## 2024-07-07 ENCOUNTER — Telehealth: Payer: Self-pay

## 2024-07-07 DIAGNOSIS — G4733 Obstructive sleep apnea (adult) (pediatric): Secondary | ICD-10-CM

## 2024-07-07 NOTE — Telephone Encounter (Signed)
 ATC pt. Will need to reschedule pt, as pt's CPAP was not ordered at [facility] on 06/09/24.  LVMTCB, sending MyChart message.

## 2024-07-10 ENCOUNTER — Ambulatory Visit: Payer: Medicare (Managed Care) | Admitting: Adult Health

## 2024-07-26 ENCOUNTER — Telehealth (HOSPITAL_BASED_OUTPATIENT_CLINIC_OR_DEPARTMENT_OTHER): Payer: Self-pay

## 2024-07-26 NOTE — Telephone Encounter (Signed)
 Copied from CRM [id number]. Topic: Clinical - Order For Equipment >> Jul 25, 2024  2:26 PM Rilla B wrote: Reason for CRM: Patient calling because he contacted [employer] for his CPAP machine and he is out of network.  Patient now has [employer]. Please call patient at [phone].

## 2024-08-01 ENCOUNTER — Ambulatory Visit: Admitting: Pulmonary Disease

## 2024-10-30 ENCOUNTER — Ambulatory Visit: Admitting: Pulmonary Disease
